# Patient Record
Sex: Male | Born: 1990 | Race: White | Hispanic: No | Marital: Single | Smoking: Never smoker
Health system: Southern US, Community
[De-identification: ages and names within clinical notes are randomized; demographics above are authoritative.]

## PROBLEM LIST (undated history)

## (undated) HISTORY — PX: HERNIA REPAIR: SHX51

---
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
## 2017-03-03 ENCOUNTER — Emergency Department (HOSPITAL_COMMUNITY): Payer: Self-pay

## 2017-03-03 ENCOUNTER — Emergency Department (HOSPITAL_COMMUNITY)
Admission: EM | Admit: 2017-03-03 | Discharge: 2017-03-03 | Disposition: A | Discharge status: Home / Self Care | Payer: Self-pay | Attending: Emergency Medicine | Admitting: Emergency Medicine

## 2017-03-03 ENCOUNTER — Encounter (HOSPITAL_COMMUNITY): Payer: Self-pay | Admitting: Emergency Medicine

## 2017-03-03 DIAGNOSIS — R072 Precordial pain: Secondary | ICD-10-CM | POA: Insufficient documentation

## 2017-03-03 DIAGNOSIS — R9431 Abnormal electrocardiogram [ECG] [EKG]: Secondary | ICD-10-CM | POA: Insufficient documentation

## 2017-03-03 LAB — CBC
HEMATOCRIT: 49.7 % (ref 39.0–52.0)
HEMOGLOBIN: 17.6 g/dL — AB (ref 13.0–17.0)
MCH: 33.5 pg (ref 26.0–34.0)
MCHC: 35.4 g/dL (ref 30.0–36.0)
MCV: 94.7 fL (ref 78.0–100.0)
Platelets: 214 10*3/uL (ref 150–400)
RBC: 5.25 MIL/uL (ref 4.22–5.81)
RDW: 12.6 % (ref 11.5–15.5)
WBC: 8.9 10*3/uL (ref 4.0–10.5)

## 2017-03-03 LAB — BASIC METABOLIC PANEL
ANION GAP: 5 (ref 5–15)
BUN: 10 mg/dL (ref 6–20)
CALCIUM: 9.5 mg/dL (ref 8.9–10.3)
CO2: 31 mmol/L (ref 22–32)
Chloride: 104 mmol/L (ref 101–111)
Creatinine, Ser: 1.05 mg/dL (ref 0.61–1.24)
GFR calc Af Amer: >60 mL/min (ref >60)
Glucose, Bld: 93 mg/dL (ref 65–99)
POTASSIUM: 4.2 mmol/L (ref 3.5–5.1)
SODIUM: 140 mmol/L (ref 135–145)

## 2017-03-03 LAB — TROPONIN I

## 2017-03-03 MED ORDER — NAPROXEN 500 MG PO TABS: 500.0000 mg | ORAL_TABLET | Freq: Two times a day (BID) | ORAL | 0 refills | Status: DC

## 2017-03-03 MED ORDER — FAMOTIDINE 20 MG PO TABS: 20.0000 mg | ORAL_TABLET | Freq: Two times a day (BID) | ORAL | 0 refills | Status: DC

## 2017-03-03 NOTE — ED Provider Notes (Signed)
[facility]-EMERGENCY DEPT Provider Note   CSN: [id number] Arrival date & time: 03/03/17  1610     History   Chief Complaint Chief Complaint  Patient presents with  . Chest Pain    HPI Chris Brown is a 26 y.o. male.  HPI Pt has been having pain in his chest since sept last year.  The sx were only slight but this past Sunday the sx increased.  After eating something he felt a sharp pain in the center of the chest and went to the left arm.   No fevers.  No vomiting although he felt nauseated when it happened.  No cough. No shortness of breath but he has felt fatigued and has been breathing harder than normal.  No leg swelling.  Pt went to [facility] and they noticed EKG changes and sent him   History reviewed. No pertinent past medical history.  Patient Active Problem List   Diagnosis Date Noted  . DISLOCATION CLOSED FINGER NOS 10/02/2009    Past Surgical History:  Procedure Laterality Date  . HERNIA REPAIR         Home Medications    Prior to Admission medications   Medication Sig Start Date End Date Taking? Authorizing Provider  famotidine (PEPCID) 20 MG tablet Take 1 tablet (20 mg total) by mouth 2 (two) times daily. 03/03/17   Linwood DibblesJon Harding Thomure, MD  naproxen (NAPROSYN) 500 MG tablet Take 1 tablet (500 mg total) by mouth 2 (two) times daily with a meal. As needed for pain 03/03/17   Linwood DibblesJon Sonam Huelsmann, MD    Family History No family history on file.  Social History Social History  Substance Use Topics  . Smoking status: Never Smoker  . Smokeless tobacco: Never Used  . Alcohol use No     Allergies   Patient has no known allergies.   Review of Systems Review of Systems  All other systems reviewed and are negative.    Physical Exam Updated Vital Signs BP 128/75   Pulse 70   Temp 98.6 F (37 C) (Oral)   Resp (!) 26   Ht 5\' 11"  (1.803 m)   Wt 103.9 kg   SpO2 96%   BMI 31.94 kg/m   Physical Exam  Constitutional: He appears well-developed and  well-nourished. No distress.  HENT:  Head: Normocephalic and atraumatic.  Right Ear: External ear normal.  Left Ear: External ear normal.  Eyes: Conjunctivae are normal. Right eye exhibits no discharge. Left eye exhibits no discharge. No scleral icterus.  Neck: Neck supple. No tracheal deviation present.  Cardiovascular: Normal rate, regular rhythm and intact distal pulses.   Pulmonary/Chest: Effort normal and breath sounds normal. No stridor. No respiratory distress. He has no wheezes. He has no rales.  Abdominal: Soft. Bowel sounds are normal. He exhibits no distension. There is no tenderness. There is no rebound and no guarding.  Musculoskeletal: He exhibits no edema or tenderness.  Neurological: He is alert. He has normal strength. No cranial nerve deficit (no facial droop, extraocular movements intact, no slurred speech) or sensory deficit. He exhibits normal muscle tone. He displays no seizure activity. Coordination normal.  Skin: Skin is warm and dry. No rash noted.  Psychiatric: He has a normal mood and affect.  Nursing note and vitals reviewed.    ED Treatments / Results  Labs (all labs ordered are listed, but only abnormal results are displayed) Labs Reviewed  CBC - Abnormal; Notable for the following:  Result Value   Hemoglobin 17.6 (*)    All other components within normal limits  BASIC METABOLIC PANEL  TROPONIN I  TROPONIN I    EKG  EKG Interpretation  Date/Time:  Tuesday March 03 2017 18:07:23 EDT Ventricular Rate:  76 PR Interval:    QRS Duration: 110 QT Interval:  366 QTC Calculation: 412 R Axis:   73 Text Interpretation:  Sinus rhythm inferior and lateral q wages.  new since last tracing Confirmed by Boe Deans  MD-J, Avyukt Cimo [phone]) on 03/03/2017 6:13:50 PM       Radiology Dg Chest 2 View  Result Date: 03/03/2017 CLINICAL DATA:  Chest pain, shortness of Breath EXAM: CHEST  2 VIEW COMPARISON:  None. FINDINGS: Cardiomediastinal silhouette is unremarkable.  No infiltrate or pleural effusion. No pulmonary edema. Bony thorax is unremarkable. IMPRESSION: No active cardiopulmonary disease. Electronically Signed   By: Natasha Mead M.D.   On: 03/03/2017 16:37    Procedures Procedures (including critical care time)  Medications Ordered in ED Medications - No data to display   Initial Impression / Assessment and Plan / ED Course  I have reviewed the triage vital signs and the nursing notes.  Pertinent labs & imaging results that were available during my care of the patient were reviewed by me and considered in my medical decision making (see chart for details).   patient presented to the emergency room with complaints of chest pain not typical for a cardiac etiology.  His laboratory tests are reassuring however he does have an abnormal EKG.  I doubt that his symptoms are related to acute coronary syndrome. It's possible his abnormal EKG could be related to pericarditis however his symptomatology does not suggest that.  Plan on discharging patient home on a course of NSAIDs. I have also prescribed Pepcid since his symptoms sound esophageal in nature.  I asked him to contact Dr. Wyline Mood, from [facility] for further evaluation of his abnormal EKG and his chest pain symptoms  Final Clinical Impressions(s) / ED Diagnoses   Final diagnoses:  Precordial pain  Abnormal ECG    New Prescriptions New Prescriptions   FAMOTIDINE (PEPCID) 20 MG TABLET    Take 1 tablet (20 mg total) by mouth 2 (two) times daily.   NAPROXEN (NAPROSYN) 500 MG TABLET    Take 1 tablet (500 mg total) by mouth 2 (two) times daily with a meal. As needed for pain     Linwood Dibbles, MD 03/03/17 2027

## 2017-03-03 NOTE — Discharge Instructions (Signed)
Please contact the cardiology office to arrange outpatient follow-up, take the medications as prescribed, return as needed for worsening symptoms

## 2017-03-11 NOTE — Progress Notes (Signed)
Cardiology Office Note   Date:  03/12/2017   ID:  Hanish, Laraia 1991-01-02, MRN [id number]  PCP:  [facility]  Cardiologist:   Charlton Haws, MD   No chief complaint on file.     History of Present Illness: Chris Brown is a 26 y.o. male who presents for evaluation/consultation for chest pain Referred by [facility] and Dr Lynelle Doctor [facility]. Reviewed ER notes from 03/03/17  Onset September 2017 [facility] chest radiates to left arm. Associated with nausea fatigue and dyspnea Sent  To ER for abnormal ECG. 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 intermittent lasts 15 minutes at most not pleuritic or positional Laying down helps it has been persistent since d/c from ER       History reviewed. No pertinent past medical history.  Past Surgical History:  Procedure Laterality Date  . HERNIA REPAIR       Current Outpatient Prescriptions  Medication Sig Dispense Refill  . famotidine (PEPCID) 20 MG tablet Take 1 tablet (20 mg total) by mouth 2 (two) times daily. 14 tablet 0  . naproxen (NAPROSYN) 500 MG tablet Take 1 tablet (500 mg total) by mouth 2 (two) times daily with a meal. As needed for pain 20 tablet 0   No current facility-administered medications for this visit.     Allergies:   Patient has no known allergies.    Social History:  The patient  reports that he has never smoked. He has never used smokeless tobacco. He reports that he does not drink alcohol or use drugs.   Family History:  The patient's family history includes CVA in his father and maternal grandmother; Cirrhosis in his maternal grandfather; Dementia in his father; Depression in his maternal grandmother; Heart attack in his paternal grandmother; Hypertension in his father; Lung cancer in his maternal grandmother.    ROS:  Please see the history  of present illness.   Otherwise, review of systems are positive for none.   All other systems are reviewed and negative.    PHYSICAL EXAM: VS:  BP 126/78 (BP Location: Right Arm)   Pulse 98   Ht  (1.803 m)   Wt 231 lb (104.8 kg)   SpO2 97%   BMI 32.22 kg/m  , BMI Body mass index is 32.22 kg/m. Affect appropriate Healthy:  appears stated age HEENT: normal Neck supple with no adenopathy JVP normal no bruits no thyromegaly Lungs clear with no wheezing and good diaphragmatic motion Heart:  S1/S2 no murmur, no rub, gallop or click PMI normal Abdomen: benighn, BS positve, no tenderness, no AAA no bruit.  No HSM or HJR Distal pulses intact with no bruits No edema Neuro non-focal Skin warm and dry No muscular weakness    EKG:  03/04/17 SR rate 85 early repolarization no pericarditis    Recent Labs: 03/03/2017: BUN 10; Creatinine, Ser 1.05; Hemoglobin 17.6; Platelets 214; Potassium 4.2; Sodium 140    Lipid Panel No results found for: CHOL, TRIG, HDL, CHOLHDL, VLDL, LDLCALC, LDLDIRECT    Wt Readings from Last 3 Encounters:  03/12/17 231 lb (104.8 kg)  03/03/17 229 lb (103.9 kg)      Other studies Reviewed: Additional studies/ records that were reviewed today include: ER notes CXR and labs notes from  [facility] .  ASSESSMENT AND PLAN:  1.  Chest Pain atypical muscular encouraged him to take motrin bid for 7 days will do POET and f/u echo  To r/o effusion or evidence of pericardial dx 2. Abnormal ECG early repolarization doubt pericardial inflammation or HOCM see above   Current medicines are reviewed at length with the patient today.  The patient does not have concerns regarding medicines.  The following changes have been made:  no change  Labs/ tests ordered today include: Echo and ETT  Orders Placed This Encounter  Procedures  . Exercise Tolerance Test  . ECHOCARDIOGRAM COMPLETE     Disposition:   FU with me as needed       Signed, Charlton Haws, MD  03/12/2017 2:33 PM    [facility] [address] [place], [place], [place]  [address] Phone: [phone]; Fax: [phone]

## 2017-03-12 ENCOUNTER — Encounter: Payer: Self-pay | Admitting: Cardiovascular Disease

## 2017-03-12 ENCOUNTER — Ambulatory Visit (INDEPENDENT_AMBULATORY_CARE_PROVIDER_SITE_OTHER): Payer: Self-pay | Admitting: Cardiovascular Disease

## 2017-03-12 VITALS — BP 126/78 | HR 98 | Ht 71.0 in | Wt 231.0 lb

## 2017-03-12 DIAGNOSIS — R072 Precordial pain: Secondary | ICD-10-CM

## 2017-03-12 NOTE — Patient Instructions (Signed)
Your physician recommends that you schedule a follow-up appointment in: as needed    Your physician has requested that you have an echocardiogram. Echocardiography is a painless test that uses sound waves to create images of your heart. It provides your doctor with information about the size and shape of your heart and how well your heart's chambers and valves are working. This procedure takes approximately one hour. There are no restrictions for this procedure.     Your physician has requested that you have an exercise tolerance test. For further information please visit [email or web address] Please also follow instruction sheet, as given.       Thank you for choosing [facility] !

## 2017-03-26 ENCOUNTER — Ambulatory Visit (HOSPITAL_COMMUNITY)
Admission: RE | Admit: 2017-03-26 | Discharge: 2017-03-26 | Disposition: A | Discharge status: Home / Self Care | Payer: Self-pay | Source: Ambulatory Visit | Attending: Cardiovascular Disease | Admitting: Cardiovascular Disease

## 2017-03-26 DIAGNOSIS — R072 Precordial pain: Secondary | ICD-10-CM

## 2017-03-26 LAB — EXERCISE TOLERANCE TEST
CHL CUP RESTING HR STRESS: 80 {beats}/min
CSEPEW: 11.8 METS
CSEPPHR: 162 {beats}/min
Exercise duration (min): 9 min
Exercise duration (sec): 33 s
MPHR: 197 {beats}/min
Percent HR: 82 %
RPE: 14

## 2017-03-26 NOTE — Progress Notes (Signed)
*  PRELIMINARY RESULTS* Echocardiogram 2D Echocardiogram has been performed.  Stacey Drain 03/26/2017, 9:23 AM

## 2018-07-18 ENCOUNTER — Emergency Department (HOSPITAL_COMMUNITY)
Admission: EM | Admit: 2018-07-18 | Discharge: 2018-07-18 | Disposition: A | Discharge status: Home / Self Care | Payer: Self-pay | Attending: Emergency Medicine | Admitting: Emergency Medicine

## 2018-07-18 ENCOUNTER — Encounter (HOSPITAL_COMMUNITY): Payer: Self-pay

## 2018-07-18 DIAGNOSIS — L03313 Cellulitis of chest wall: Secondary | ICD-10-CM | POA: Insufficient documentation

## 2018-07-18 MED ORDER — DOXYCYCLINE HYCLATE 100 MG PO CAPS: 100.0000 mg | ORAL_CAPSULE | Freq: Two times a day (BID) | ORAL | 0 refills | Status: DC

## 2018-07-18 MED ORDER — DOXYCYCLINE HYCLATE 100 MG PO TABS: 100.0000 mg | ORAL_TABLET | Freq: Once | ORAL | Status: DC

## 2018-07-18 MED ORDER — DOXYCYCLINE HYCLATE 100 MG PO TABS
ORAL_TABLET | ORAL | Status: AC
  Filled 2018-07-18: qty 1

## 2018-07-18 MED ORDER — DOXYCYCLINE HYCLATE 100 MG PO TABS
100.0000 mg | ORAL_TABLET | Freq: Once | ORAL | Status: AC
  Administered 2018-07-18: 100 mg via ORAL

## 2018-07-18 NOTE — ED Triage Notes (Signed)
Pt has a red, hard area to right shoulder area. Pt reports that he noticed it Friday morning

## 2018-07-18 NOTE — Discharge Instructions (Addendum)
Warm Epsom salt compresses to area for 20 minutes at a time. Take Doxycycline as prescribed and complete the full course.

## 2018-07-18 NOTE — ED Provider Notes (Signed)
[facility] EMERGENCY DEPARTMENT Provider Note   CSN: [id number] Arrival date & time: 07/18/18  1344     History   Chief Complaint Chief Complaint  Patient presents with  . Abscess    HPI Chris Brown is a 27 y.o. male.  27 yo male presents with complaint of a red tender/firm area to the right upper chest wall/shoulder area x 2 days. Patient thought at first he had a bug bite but area persists, is getting larger and more painful. No history of prior abscess or skin infections, otherwise healthy. No other complaints or concerns.      History reviewed. No pertinent past medical history.  Patient Active Problem List   Diagnosis Date Noted  . DISLOCATION CLOSED FINGER NOS 10/02/2009    Past Surgical History:  Procedure Laterality Date  . HERNIA REPAIR          Home Medications    Prior to Admission medications   Medication Sig Start Date End Date Taking? Authorizing Provider  doxycycline (VIBRAMYCIN) 100 MG capsule Take 1 capsule (100 mg total) by mouth 2 (two) times daily. 07/18/18   Jeannie Fend, PA-C  famotidine (PEPCID) 20 MG tablet Take 1 tablet (20 mg total) by mouth 2 (two) times daily. 03/03/17   Linwood Dibbles, MD  naproxen (NAPROSYN) 500 MG tablet Take 1 tablet (500 mg total) by mouth 2 (two) times daily with a meal. As needed for pain 03/03/17   Linwood Dibbles, MD    Family History Family History  Problem Relation Age of Onset  . Hypertension Father   . CVA Father   . Dementia Father   . CVA Maternal Grandmother   . Depression Maternal Grandmother   . Lung cancer Maternal Grandmother   . Cirrhosis Maternal Grandfather   . Heart attack Paternal Grandmother     Social History Social History   Tobacco Use  . Smoking status: Never Smoker  . Smokeless tobacco: Never Used  Substance Use Topics  . Alcohol use: No  . Drug use: No     Allergies   Patient has no known allergies.   Review of Systems Review of Systems  Constitutional: Negative for  fever.  Musculoskeletal: Positive for myalgias. Negative for arthralgias.  Skin: Positive for color change. Negative for wound.  Allergic/Immunologic: Negative for immunocompromised state.  Hematological: Negative for adenopathy.  All other systems reviewed and are negative.    Physical Exam Updated Vital Signs BP (!) 149/87 (BP Location: Left Arm)   Pulse 78   Temp (!) 97.4 F (36.3 C) (Oral)   Resp 16   Ht 5\' 10"  (1.778 m)   Wt 104.3 kg   SpO2 99%   BMI 33.00 kg/m   Physical Exam  Constitutional: He is oriented to person, place, and time. He appears well-developed and well-nourished. No distress.  HENT:  Head: Normocephalic and atraumatic.  Cardiovascular: Intact distal pulses.  Pulmonary/Chest: Effort normal.  Musculoskeletal: He exhibits tenderness. He exhibits no deformity.  Neurological: He is alert and oriented to person, place, and time.  Skin: Skin is warm and dry. He is not diaphoretic. There is erythema.     Psychiatric: He has a normal mood and affect. His behavior is normal.  Nursing note and vitals reviewed.    ED Treatments / Results  Labs (all labs ordered are listed, but only abnormal results are displayed) Labs Reviewed - No data to display  EKG None  Radiology No results found.  Procedures Procedures (including critical  care time)  Medications Ordered in ED Medications  doxycycline (VIBRA-TABS) tablet 100 mg (has no administration in time range)     Initial Impression / Assessment and Plan / ED Course  I have reviewed the triage vital signs and the nursing notes.  Pertinent labs & imaging results that were available during my care of the patient were reviewed by me and considered in my medical decision making (see chart for details).  Clinical Course as of Jul 18 1408  Wynelle LinkSun Jul 18, 2018  1409 Cellulitis vs early abscess to right chest wall. Recommend doxycycline and warm compresses, recheck with PCP in 2 days, return to ER for  worsening or concerning symptoms.   [LM]    Clinical Course User Index [LM] Jeannie FendMurphy, Jaziya Obarr A, PA-C    Final Clinical Impressions(s) / ED Diagnoses   Final diagnoses:  Cellulitis of chest wall    ED Discharge Orders         Ordered    doxycycline (VIBRAMYCIN) 100 MG capsule  2 times daily     07/18/18 1400           Alden HippMurphy, Yonael Tulloch A, PA-C 07/18/18 1410    Cathren LaineSteinl, Kevin, MD 07/18/18 1534

## 2018-07-18 NOTE — ED Notes (Signed)
Raised reddened area to R upper chest shoulder area resembling a boil There is no visible head  Area is hard, raised and reddened

## 2018-07-21 ENCOUNTER — Other Ambulatory Visit: Payer: Self-pay

## 2018-07-21 ENCOUNTER — Emergency Department (HOSPITAL_COMMUNITY)
Admission: EM | Admit: 2018-07-21 | Discharge: 2018-07-21 | Disposition: A | Discharge status: Home / Self Care | Payer: Self-pay | Attending: Emergency Medicine | Admitting: Emergency Medicine

## 2018-07-21 ENCOUNTER — Encounter (HOSPITAL_COMMUNITY): Payer: Self-pay | Admitting: Emergency Medicine

## 2018-07-21 DIAGNOSIS — Z79899 Other long term (current) drug therapy: Secondary | ICD-10-CM | POA: Insufficient documentation

## 2018-07-21 DIAGNOSIS — L089 Local infection of the skin and subcutaneous tissue, unspecified: Secondary | ICD-10-CM

## 2018-07-21 DIAGNOSIS — L723 Sebaceous cyst: Secondary | ICD-10-CM | POA: Insufficient documentation

## 2018-07-21 MED ORDER — POVIDONE-IODINE 10 % EX SOLN
CUTANEOUS | Status: DC | PRN
  Administered 2018-07-21: 17:00:00 via TOPICAL
  Filled 2018-07-21: qty 15

## 2018-07-21 MED ORDER — LIDOCAINE HCL (PF) 1 % IJ SOLN
5.0000 mL | Freq: Once | INTRAMUSCULAR | Status: AC
  Administered 2018-07-21: 5 mL
  Filled 2018-07-21: qty 6

## 2018-07-21 MED ORDER — LIDOCAINE HCL (PF) 1 % IJ SOLN
INTRAMUSCULAR | Status: AC
  Administered 2018-07-21: 17:00:00
  Filled 2018-07-21: qty 2

## 2018-07-21 NOTE — ED Triage Notes (Signed)
Pt states abscess to R shoulder is worse. Has been using warm compresses and antibx. No drainage, fever, N/V/D/.

## 2018-07-21 NOTE — Discharge Instructions (Addendum)
Continue the antibiotics you were prescribed here on Sunday and continue warm compresses to your shoulder (or a warm shower massage to the site) for 10-minutes 2-3 times daily.  Get rechecked for any worsened symptoms.

## 2018-07-21 NOTE — ED Provider Notes (Signed)
[facility] EMERGENCY DEPARTMENT Provider Note   CSN: [id number] Arrival date & time: 07/21/18  1450     History   Chief Complaint Chief Complaint  Patient presents with  . Abscess    HPI Ala BentGeorge W Stringfellow is a 27 y.o. male who was seen here 3 days and placed on doxycycline for a presumptive early cellulitis or abscess returns due to worsened pain and swelling at the site of infection at his right anterior shoulder.  He reports using warm compresses as well but the site continues to enlarge without drainage.  He has found no alleviators.  Denies fevers, chills, myalgias or other complaint.  HPI  History reviewed. No pertinent past medical history.  Patient Active Problem List   Diagnosis Date Noted  . DISLOCATION CLOSED FINGER NOS 10/02/2009    Past Surgical History:  Procedure Laterality Date  . HERNIA REPAIR          Home Medications    Prior to Admission medications   Medication Sig Start Date End Date Taking? Authorizing Provider  doxycycline (VIBRAMYCIN) 100 MG capsule Take 1 capsule (100 mg total) by mouth 2 (two) times daily. 07/18/18   Jeannie FendMurphy, Laura A, PA-C  famotidine (PEPCID) 20 MG tablet Take 1 tablet (20 mg total) by mouth 2 (two) times daily. 03/03/17   Linwood DibblesKnapp, Jon, MD  naproxen (NAPROSYN) 500 MG tablet Take 1 tablet (500 mg total) by mouth 2 (two) times daily with a meal. As needed for pain 03/03/17   Linwood DibblesKnapp, Jon, MD    Family History Family History  Problem Relation Age of Onset  . Hypertension Father   . CVA Father   . Dementia Father   . CVA Maternal Grandmother   . Depression Maternal Grandmother   . Lung cancer Maternal Grandmother   . Cirrhosis Maternal Grandfather   . Heart attack Paternal Grandmother     Social History Social History   Tobacco Use  . Smoking status: Never Smoker  . Smokeless tobacco: Never Used  Substance Use Topics  . Alcohol use: No  . Drug use: No     Allergies   Patient has no known allergies.   Review of  Systems Review of Systems  Constitutional: Negative for chills and fever.  Musculoskeletal: Negative for myalgias.  Skin: Positive for color change and wound.  Neurological: Negative for numbness.     Physical Exam Updated Vital Signs BP (!) 144/91   Pulse 95   Temp 98.9 F (37.2 C) (Oral)   Resp 15   Wt 104.3 kg   SpO2 100%   BMI 33.00 kg/m   Physical Exam  Constitutional: He appears well-developed and well-nourished. No distress.  HENT:  Head: Normocephalic.  Neck: Neck supple.  Cardiovascular: Normal rate.  Pulmonary/Chest: Effort normal. He has no wheezes.  Musculoskeletal: Normal range of motion. He exhibits no edema.  Skin: There is erythema.  4 cm induration right anterior shoulder with central fluctuance.  Abscess palpated deep within the axilla.  No axillary adenopathy.     ED Treatments / Results  Labs (all labs ordered are listed, but only abnormal results are displayed) Labs Reviewed - No data to display  EKG None  Radiology No results found.  Procedures Procedures (including critical care time)  INCISION AND DRAINAGE Performed by: Burgess AmorIDOL, Quantavis Obryant Consent: Verbal consent obtained. Risks and benefits: risks, benefits and alternatives were discussed Type: abscess  Body area: right shoulder/axilla  Anesthesia: local infiltration  Incision was made with a scalpel.  Local  anesthetic: lidocaine 1% without epinephrine  Anesthetic total: 4 ml  Complexity: complex Blunt dissection to break up loculations  Drainage: purulent plus moderate amount of sebum material   Drainage amount: copious  Packing material: no packing  Patient tolerance: Patient tolerated the procedure well with no immediate complications.     Medications Ordered in ED Medications  lidocaine (PF) (XYLOCAINE) 1 % injection 5 mL (5 mLs Other Given by Other 07/21/18 1654)  lidocaine (PF) (XYLOCAINE) 1 % injection (  Given by Other 07/21/18 1655)     Initial Impression /  Assessment and Plan / ED Course  I have reviewed the triage vital signs and the nursing notes.  Pertinent labs & imaging results that were available during my care of the patient were reviewed by me and considered in my medical decision making (see chart for details).     Pt advised to continue warm soaks and complete course of abx prescribed at last visit. Prn f/u anticipated, return precautions discussed.  Final Clinical Impressions(s) / ED Diagnoses   Final diagnoses:  Infected sebaceous cyst of skin    ED Discharge Orders    None       Victoriano Laindol, Quetzaly Ebner, PA-C 07/21/18 2214    Donnetta Hutchingook, Brian, MD 07/22/18 [phone]

## 2019-11-01 ENCOUNTER — Encounter (HOSPITAL_COMMUNITY): Payer: Self-pay | Admitting: Emergency Medicine

## 2019-11-01 ENCOUNTER — Other Ambulatory Visit: Payer: Self-pay

## 2019-11-01 ENCOUNTER — Emergency Department (HOSPITAL_COMMUNITY)
Admission: EM | Admit: 2019-11-01 | Discharge: 2019-11-01 | Disposition: A | Discharge status: Home / Self Care | Payer: Self-pay | Attending: Emergency Medicine | Admitting: Emergency Medicine

## 2019-11-01 DIAGNOSIS — K047 Periapical abscess without sinus: Secondary | ICD-10-CM

## 2019-11-01 DIAGNOSIS — R197 Diarrhea, unspecified: Secondary | ICD-10-CM | POA: Insufficient documentation

## 2019-11-01 DIAGNOSIS — K0889 Other specified disorders of teeth and supporting structures: Secondary | ICD-10-CM | POA: Insufficient documentation

## 2019-11-01 DIAGNOSIS — R1084 Generalized abdominal pain: Secondary | ICD-10-CM | POA: Insufficient documentation

## 2019-11-01 LAB — CBC WITH DIFFERENTIAL/PLATELET
Abs Immature Granulocytes: 0.03 10*3/uL (ref 0.00–0.07)
Basophils Absolute: 0 10*3/uL (ref 0.0–0.1)
Basophils Relative: 0 %
Eosinophils Absolute: 0.1 10*3/uL (ref 0.0–0.5)
Eosinophils Relative: 1 %
HCT: 48.3 % (ref 39.0–52.0)
Hemoglobin: 16.5 g/dL (ref 13.0–17.0)
Immature Granulocytes: 0 %
Lymphocytes Relative: 18 %
Lymphs Abs: 1.3 10*3/uL (ref 0.7–4.0)
MCH: 32.5 pg (ref 26.0–34.0)
MCHC: 34.2 g/dL (ref 30.0–36.0)
MCV: 95.1 fL (ref 80.0–100.0)
Monocytes Absolute: 0.4 10*3/uL (ref 0.1–1.0)
Monocytes Relative: 5 %
Neutro Abs: 5.6 10*3/uL (ref 1.7–7.7)
Neutrophils Relative %: 76 %
Platelets: 208 10*3/uL (ref 150–400)
RBC: 5.08 MIL/uL (ref 4.22–5.81)
RDW: 11.9 % (ref 11.5–15.5)
WBC: 7.4 10*3/uL (ref 4.0–10.5)
nRBC: 0 % (ref 0.0–0.2)

## 2019-11-01 LAB — LIPASE, BLOOD: Lipase: 25 U/L (ref 11–51)

## 2019-11-01 LAB — COMPREHENSIVE METABOLIC PANEL
ALT: 44 U/L (ref 0–44)
AST: 38 U/L (ref 15–41)
Albumin: 4.2 g/dL (ref 3.5–5.0)
Alkaline Phosphatase: 73 U/L (ref 38–126)
Anion gap: 5 (ref 5–15)
BUN: 13 mg/dL (ref 6–20)
CO2: 29 mmol/L (ref 22–32)
Calcium: 9.4 mg/dL (ref 8.9–10.3)
Chloride: 105 mmol/L (ref 98–111)
Creatinine, Ser: 1.05 mg/dL (ref 0.61–1.24)
GFR calc Af Amer: >60 mL/min (ref >60)
GFR calc non Af Amer: >60 mL/min (ref >60)
Glucose, Bld: 101 mg/dL — ABNORMAL HIGH (ref 70–99)
Potassium: 4 mmol/L (ref 3.5–5.1)
Sodium: 139 mmol/L (ref 135–145)
Total Bilirubin: 0.8 mg/dL (ref 0.3–1.2)
Total Protein: 7.7 g/dL (ref 6.5–8.1)

## 2019-11-01 MED ORDER — CLINDAMYCIN HCL 150 MG PO CAPS: 300.0000 mg | ORAL_CAPSULE | Freq: Three times a day (TID) | ORAL | 0 refills | Status: AC

## 2019-11-01 MED ORDER — ACETAMINOPHEN 325 MG PO TABS
650.0000 mg | ORAL_TABLET | Freq: Once | ORAL | Status: AC
  Administered 2019-11-01: 650 mg via ORAL
  Filled 2019-11-01: qty 2

## 2019-11-01 NOTE — ED Provider Notes (Signed)
[facility] EMERGENCY DEPARTMENT Provider Note   CSN: [id number] Arrival date & time: 11/01/19  1154     History   Chief Complaint Chief Complaint  Patient presents with  . Abdominal Pain    HPI Chris Brown is a 28 y.o. male.     28 y.o male with no PMH presents to the ED with a chief complaint of abdominal pain x 1 week. Patient reports he first noted foul taste in his mouth, then he proceeded to have some diarrhea last week.  He reports the diarrhea has subsided, the abdominal pain has gone away.  However he continues to taste a sour in his mouth, he also reports a foul smell to his mouth.  He has not taken any medication for improvement in his symptoms.  He is aware he needs dental repairs however he reports he does not feel that this is the source of his symptoms.  On today's visit he reports his abdominal pain has resolved, he has had no nausea, vomiting, fevers.  No dysphagia, odynophagia.  The history is provided by the patient.  Abdominal Pain Associated symptoms: no chest pain, no fever, no nausea, no shortness of breath and no vomiting     History reviewed. No pertinent past medical history.  Patient Active Problem List   Diagnosis Date Noted  . DISLOCATION CLOSED FINGER NOS 10/02/2009    Past Surgical History:  Procedure Laterality Date  . HERNIA REPAIR          Home Medications    Prior to Admission medications   Medication Sig Start Date End Date Taking? Authorizing Provider  clindamycin (CLEOCIN) 150 MG capsule Take 2 capsules (300 mg total) by mouth 3 (three) times daily for 7 days. 11/01/19 11/08/19  Janeece Fitting, PA-C    Family History Family History  Problem Relation Age of Onset  . Hypertension Father   . CVA Father   . Dementia Father   . CVA Maternal Grandmother   . Depression Maternal Grandmother   . Lung cancer Maternal Grandmother   . Cirrhosis Maternal Grandfather   . Heart attack Paternal Grandmother     Social History Social  History   Tobacco Use  . Smoking status: Never Smoker  . Smokeless tobacco: Never Used  Substance Use Topics  . Alcohol use: No  . Drug use: No     Allergies   Patient has no known allergies.   Review of Systems Review of Systems  Constitutional: Negative for fever.  HENT: Positive for dental problem.   Respiratory: Negative for shortness of breath.   Cardiovascular: Negative for chest pain.  Gastrointestinal: Positive for abdominal pain. Negative for nausea and vomiting.  Genitourinary: Negative for flank pain.  All other systems reviewed and are negative.    Physical Exam Updated Vital Signs BP (!) 143/98 (BP Location: Right Arm)   Pulse 100   Temp 98.2 F (36.8 C) (Oral)   Resp 16   Ht 6' (1.829 m)   Wt 108.4 kg   SpO2 100%   BMI 32.41 kg/m   Physical Exam Vitals signs and nursing note reviewed.  Constitutional:      Appearance: He is well-developed.  HENT:     Head: Normocephalic and atraumatic.     Mouth/Throat:     Lips: Pink.     Mouth: Mucous membranes are moist.     Dentition: Abnormal dentition. Dental tenderness, dental caries and dental abscesses present.     Tongue: No lesions.  Pharynx: Oropharynx is clear. Uvula midline.     Tonsils: No tonsillar exudate or tonsillar abscesses. 0 on the right. 0 on the left.     Comments: Poor dentition throughout. abscess noted to the back of the right side of his mouth.  Cardiovascular:     Rate and Rhythm: Normal rate.  Pulmonary:     Effort: Pulmonary effort is normal.     Breath sounds: Normal breath sounds.  Abdominal:     General: Abdomen is flat. Bowel sounds are normal.     Palpations: Abdomen is soft.     Tenderness: There is no abdominal tenderness. There is no right CVA tenderness or left CVA tenderness.  Neurological:     Mental Status: He is alert and oriented to person, place, and time.      ED Treatments / Results  Labs (all labs ordered are listed, but only abnormal results are  displayed) Labs Reviewed  COMPREHENSIVE METABOLIC PANEL - Abnormal; Notable for the following components:      Result Value   Glucose, Bld 101 (*)    All other components within normal limits  LIPASE, BLOOD  CBC WITH DIFFERENTIAL/PLATELET    EKG None  Radiology No results found.  Procedures Procedures (including critical care time)  Medications Ordered in ED Medications  acetaminophen (TYLENOL) tablet 650 mg (has no administration in time range)     Initial Impression / Assessment and Plan / ED Course  I have reviewed the triage vital signs and the nursing notes.  Pertinent labs & imaging results that were available during my care of the patient were reviewed by me and considered in my medical decision making (see chart for details).      Patient with no PMH presents to the ED with a chief complaint of abdominal pain and sour taste in his mouth x 1 week. He reports multiple episodes of diarrhea without any blood in his stool.  He then reports abdominal pain generalized which has now subsided.  He arrived in the ED hemodynamically stable, heart rate slightly elevated but reports smoking prior to arrival in the ED.     CMP showed no electrolyte derailment, he denies any vomiting or nausea. CBC with no leukocytosis, hemoglobin is within normal limits. Lipase is normal. He was given Tylenol in the ED to help with his pain.  Upon examination of his oropharynx, there is poor dentition throughout, multiple missing teeth along with multiple abscesses noted to the back of his mouth.  He has been afebrile.  Denies any dysphagia or difficulty swallowing. No facial swelling. Will provide patient with script for clindamycin to clear his dental abscess. He will also be provided with the number to the free dental clinic in order to schedule an appointment.   I feel that patient is stable for discharge without emergent condition. Patient understand and agrees with management. Return precautions  provided at length.   Portions of this note were generated with Scientist, clinical (histocompatibility and immunogenetics). Dictation errors may occur despite best attempts at proofreading.  Final Clinical Impressions(s) / ED Diagnoses   Final diagnoses:  Generalized abdominal pain  Dental abscess    ED Discharge Orders         Ordered    clindamycin (CLEOCIN) 150 MG capsule  3 times daily     11/01/19 1415           Claude Manges, PA-C 11/01/19 1422    Donnetta Hutching, MD 11/06/19 2037

## 2019-11-01 NOTE — ED Triage Notes (Signed)
PT states generalized abdominal pain with no n/v/d x2 days. PT states he has had a sour/bitter taste in his mouth for over 2 weeks and some diarrhea x2 weeks ago.

## 2019-11-01 NOTE — Discharge Instructions (Addendum)
I have provided antibiotics for your dental infection.Please take 2 capsules four times a day for the next 7 days.If you experience any fever, difficulty swallowing, voice changes or shortness of breath please return to the ED for reevaluation.   I have also provided the contact to the free dental clinic, please schedule an appointment for evaluation.

## 2020-03-24 ENCOUNTER — Encounter (HOSPITAL_COMMUNITY): Payer: Self-pay | Admitting: Emergency Medicine

## 2020-03-24 ENCOUNTER — Emergency Department (HOSPITAL_COMMUNITY)
Admission: EM | Admit: 2020-03-24 | Discharge: 2020-03-24 | Disposition: A | Discharge status: Home / Self Care | Payer: Self-pay | Attending: Emergency Medicine | Admitting: Emergency Medicine

## 2020-03-24 ENCOUNTER — Other Ambulatory Visit: Payer: Self-pay

## 2020-03-24 ENCOUNTER — Emergency Department (HOSPITAL_COMMUNITY): Payer: Self-pay

## 2020-03-24 DIAGNOSIS — J302 Other seasonal allergic rhinitis: Secondary | ICD-10-CM | POA: Insufficient documentation

## 2020-03-24 DIAGNOSIS — R0982 Postnasal drip: Secondary | ICD-10-CM | POA: Insufficient documentation

## 2020-03-24 DIAGNOSIS — R0981 Nasal congestion: Secondary | ICD-10-CM | POA: Insufficient documentation

## 2020-03-24 NOTE — Discharge Instructions (Signed)
Take Zyrtec once daily for allergies Use Flonase or Nasonex nasal spray for congestion Use nasal saline to moisturize the nasal passages Please follow up with your doctor

## 2020-03-24 NOTE — ED Triage Notes (Signed)
Patient c/o intermittent blood in sputum. Per patient brown reddish color. Patient states occasional cough. Denies any fevers. Per patient has had had some nasal congested.

## 2020-03-24 NOTE — ED Provider Notes (Signed)
[facility] EMERGENCY DEPARTMENT Provider Note   CSN: [id number] Arrival date & time: 03/24/20  1704     History Chief Complaint  Patient presents with  . blood in sputum    Chris Brown is a 29 y.o. male who presents with spitting up blood.  Patient states his he has been having a lot of issues with allergies since February.  He has problems with nasal congestion that comes and goes, postnasal drip, throat irritation from a dry throat.  Occasionally he will notice some dark blood-tinged sputum when he spits.  He is not coughing up blood or vomiting blood.  Today he felt a little bit lightheaded and nauseous and so he decided to come to the emergency department to be checked out.  He has not tried anything for his symptoms.  No current chest pain, shortness of breath, cough, wheezing  HPI     History reviewed. No pertinent past medical history.  Patient Active Problem List   Diagnosis Date Noted  . DISLOCATION CLOSED FINGER NOS 10/02/2009    Past Surgical History:  Procedure Laterality Date  . HERNIA REPAIR         Family History  Problem Relation Age of Onset  . Hypertension Father   . CVA Father   . Dementia Father   . CVA Maternal Grandmother   . Depression Maternal Grandmother   . Lung cancer Maternal Grandmother   . Cirrhosis Maternal Grandfather   . Heart attack Paternal Grandmother     Social History   Tobacco Use  . Smoking status: Never Smoker  . Smokeless tobacco: Never Used  Substance Use Topics  . Alcohol use: No  . Drug use: No    Home Medications Prior to Admission medications   Not on File    Allergies    Patient has no known allergies.  Review of Systems   Review of Systems  Constitutional: Negative for fever.  HENT: Positive for congestion, postnasal drip and sneezing. Negative for nosebleeds.   Respiratory: Negative for cough and shortness of breath.   Cardiovascular: Negative for chest pain.  Allergic/Immunologic: Positive for  environmental allergies.    Physical Exam Updated Vital Signs BP (!) 144/107 (BP Location: Right Arm)   Pulse 98   Temp 98.1 F (36.7 C) (Oral)   Resp 20   Ht 5\' 11"  (1.803 m)   Wt 104.3 kg   SpO2 100%   BMI 32.08 kg/m   Physical Exam Vitals and nursing note reviewed.  Constitutional:      General: He is not in acute distress.    Appearance: Normal appearance. He is well-developed. He is not ill-appearing.  HENT:     Head: Normocephalic and atraumatic.     Right Ear: There is impacted cerumen.     Left Ear: Tympanic membrane normal.     Nose: Congestion present.     Right Nostril: No epistaxis.     Left Nostril: Epistaxis (small bloody area of the mucosa on the septum) present.     Right Turbinates: Enlarged.     Left Turbinates: Enlarged.  Eyes:     General: No scleral icterus.       Right eye: No discharge.        Left eye: No discharge.     Conjunctiva/sclera: Conjunctivae normal.     Pupils: Pupils are equal, round, and reactive to light.  Cardiovascular:     Rate and Rhythm: Normal rate and regular rhythm.  Pulmonary:  Effort: Pulmonary effort is normal. No respiratory distress.     Breath sounds: Wheezing (Slight expiratory wheeze in the right upper lobe) present.  Abdominal:     General: There is no distension.  Musculoskeletal:     Cervical back: Normal range of motion.  Skin:    General: Skin is warm and dry.  Neurological:     Mental Status: He is alert and oriented to person, place, and time.  Psychiatric:        Behavior: Behavior normal.     ED Results / Procedures / Treatments   Labs (all labs ordered are listed, but only abnormal results are displayed) Labs Reviewed - No data to display  EKG None  Radiology DG Chest Portable 1 View  Result Date: 03/24/2020 CLINICAL DATA:  Cough. EXAM: PORTABLE CHEST 1 VIEW COMPARISON:  03/03/2017 FINDINGS: Left costophrenic angle not included in the field of view.The cardiomediastinal contours are  normal for portable technique. The lungs are clear. Pulmonary vasculature is normal. No consolidation, pleural effusion, or pneumothorax. No acute osseous abnormalities are seen. IMPRESSION: No acute chest findings. Electronically Signed   By: Narda Rutherford M.D.   On: 03/24/2020 17:46    Procedures Procedures (including critical care time)  Medications Ordered in ED Medications - No data to display  ED Course  I have reviewed the triage vital signs and the nursing notes.  Pertinent labs & imaging results that were available during my care of the patient were reviewed by me and considered in my medical decision making (see chart for details).  29 year old male presents with allergy symptoms with nasal congestion, postnasal drip, sneezing, and dry irritated throat for several months.  Has been spitting up blood which is likely from irritated nasal mucosa.  He is no hemoptysis or hematemesis.  He has no chest pain or shortness of breath or cough.  Chest x-ray was obtained in triage and is negative.  Patient given reassurance and advised to start Zyrtec, Flonase, nasal saline.  MDM Rules/Calculators/A&P                       Final Clinical Impression(s) / ED Diagnoses Final diagnoses:  Post-nasal drip  Seasonal allergies  Nasal congestion    Rx / DC Orders ED Discharge Orders    None       Bethel Born, PA-C 03/24/20 1929    Vanetta Mulders, MD 04/02/20 [phone]

## 2020-08-22 ENCOUNTER — Emergency Department (HOSPITAL_COMMUNITY)
Admission: EM | Admit: 2020-08-22 | Discharge: 2020-08-22 | Disposition: A | Discharge status: Home / Self Care | Payer: Self-pay | Attending: Emergency Medicine | Admitting: Emergency Medicine

## 2020-08-22 ENCOUNTER — Encounter (HOSPITAL_COMMUNITY): Payer: Self-pay

## 2020-08-22 ENCOUNTER — Other Ambulatory Visit: Payer: Self-pay

## 2020-08-22 DIAGNOSIS — K0889 Other specified disorders of teeth and supporting structures: Secondary | ICD-10-CM

## 2020-08-22 DIAGNOSIS — K029 Dental caries, unspecified: Secondary | ICD-10-CM | POA: Insufficient documentation

## 2020-08-22 MED ORDER — PENICILLIN V POTASSIUM 500 MG PO TABS: 500.0000 mg | ORAL_TABLET | Freq: Three times a day (TID) | ORAL | 0 refills | Status: AC

## 2020-08-22 NOTE — Discharge Instructions (Signed)
You have a dental infection.  Please take the antibiotics as prescribed.  As discussed this requires definitive care with a dentist.  Please call the dentist office and I have provided you the phone number and information for.  Please take the antibiotic 3 times daily as prescribed for the entire course.  Please eat food before you take the antibiotics otherwise he may have some nausea. Return to the emergency department for new or concerning symptoms.  Please use Tylenol or ibuprofen for pain.  You may use 600 mg ibuprofen every 6 hours or 1000 mg of Tylenol every 6 hours.  You may choose to alternate between the 2.  This would be most effective.  Not to exceed 4 g of Tylenol within 24 hours.  Not to exceed 3200 mg ibuprofen 24 hours.

## 2020-08-22 NOTE — ED Triage Notes (Signed)
Pt c/o toothache that started Friday.  Reports started having facial swelling yesterday.

## 2020-08-22 NOTE — ED Provider Notes (Signed)
[facility] EMERGENCY DEPARTMENT Provider Note   CSN: [id number] Arrival date & time: 08/22/20  1884     History Chief Complaint  Patient presents with  . Dental Pain    Chris Brown is a 29 y.o. male.  The history is provided by the patient.  Dental Pain Location:  Upper Upper teeth location:  10/LU lateral incisor, 14/LU 1st molar, 15/LU 2nd molar, 11/LU cuspid, 9/LU central incisor, 13/LU 2nd bicuspid, 12/LU 1st bicuspid and 16/LU 3rd molar Quality:  Aching, dull and constant Severity:  Moderate Onset quality:  Gradual Duration:  5 days Timing:  Constant Progression:  Worsening Chronicity:  New Associated symptoms: no congestion, no fever and no neck pain    Patient is complaining of left upper and frontal dental pain that has been ongoing since Friday.  He states that it has been constant achy worsening.  He has very poor dentition and states that he does not see a dentist regularly he has tried to find a dentist to provide him with a payment plan however he states [place] has been [place]  He denies any fevers, chills, nausea, vomiting, lightheadedness, fatigue or malaise.  He denies any difficulty opening his mouth or chewing. He states that his face is felt swollen the last 2 days.    No past medical history on file.  Patient Active Problem List   Diagnosis Date Noted  . DISLOCATION CLOSED FINGER NOS 10/02/2009    Past Surgical History:  Procedure Laterality Date  . HERNIA REPAIR         Family History  Problem Relation Age of Onset  . Hypertension Father   . CVA Father   . Dementia Father   . CVA Maternal Grandmother   . Depression Maternal Grandmother   . Lung cancer Maternal Grandmother   . Cirrhosis Maternal Grandfather   . Heart attack Paternal Grandmother     Social History   Tobacco Use  . Smoking status: Never Smoker  . Smokeless tobacco: Never Used  Vaping Use  . Vaping Use: Never used  Substance  Use Topics  . Alcohol use: No  . Drug use: No    Home Medications Prior to Admission medications   Medication Sig Start Date End Date Taking? Authorizing Provider  penicillin v potassium (VEETID) 500 MG tablet Take 1 tablet (500 mg total) by mouth 3 (three) times daily for 7 days. 08/22/20 08/29/20  Gailen Shelter, PA    Allergies    Patient has no known allergies.  Review of Systems   Review of Systems  Constitutional: Negative for chills, fatigue and fever.  HENT: Positive for dental problem. Negative for congestion.   Respiratory: Negative for shortness of breath.   Cardiovascular: Negative for chest pain.  Gastrointestinal: Negative for abdominal pain.  Musculoskeletal: Negative for neck pain.    Physical Exam Updated Vital Signs BP (!) 143/89 (BP Location: Right Arm)   Pulse 79   Temp 97.8 F (36.6 C) (Oral)   Resp 18   Ht 5\' 11"  (1.803 m)   Wt 108.9 kg   SpO2 98%   BMI 33.47 kg/m   Physical Exam Vitals and nursing note reviewed.  Constitutional:      General: He is not in acute distress. HENT:     Head: Normocephalic and atraumatic.     Comments: No trismus.  Full range of motion of tongue.  No obvious facial swelling.  Face is symmetric.  Nose: Rhinorrhea present.     Mouth/Throat:     Mouth: Mucous membranes are moist.      Comments: Patient with diffusely poor dentition.  The marked teeth above are completely eroded to the gumline.  There are no obvious periapical abscesses but there is some tenderness to palpation of the gumline.  Full range motion of tongue.  No trismus.  Posterior pharynx without erythema or cobblestoning. Eyes:     General: No scleral icterus. Cardiovascular:     Rate and Rhythm: Normal rate and regular rhythm.     Pulses: Normal pulses.     Heart sounds: Normal heart sounds.  Pulmonary:     Effort: Pulmonary effort is normal. No respiratory distress.     Breath sounds: No wheezing.  Abdominal:     Palpations: Abdomen is  soft.     Tenderness: There is no abdominal tenderness.  Musculoskeletal:     Cervical back: Normal range of motion.     Right lower leg: No edema.     Left lower leg: No edema.  Skin:    General: Skin is warm and dry.     Capillary Refill: Capillary refill takes less than 2 seconds.  Neurological:     Mental Status: He is alert. Mental status is at baseline.  Psychiatric:        Mood and Affect: Mood normal.        Behavior: Behavior normal.     ED Results / Procedures / Treatments   Labs (all labs ordered are listed, but only abnormal results are displayed) Labs Reviewed - No data to display  EKG None  Radiology No results found.  Procedures Procedures (including critical care time)  Medications Ordered in ED Medications - No data to display  ED Course  I have reviewed the triage vital signs and the nursing notes.  Pertinent labs & imaging results that were available during my care of the patient were reviewed by me and considered in my medical decision making (see chart for details).    MDM Rules/Calculators/A&P                          Patient is 29 year old male with past medical history detailed above presented today with left upper dental pain for 5 days.  Has been worsening achy constant.  He has not taken anything for pain.  He does not see a dentist.  He is open to seeing 1 and states that he understands the need for definitive care.  He denies any systemic symptoms.  Physical exam is notable for significant dental erosion of the left upper and frontal upper teeth.  No obvious dental abscess no trismus for range of motion of tongue.  Doubt Ludwick's or deep space abscess.  No significant facial swelling on my exam.  No significant lymphadenopathy.  We will provide patient with penicillin and close dental follow-up.  Given return precautions.   Final Clinical Impression(s) / ED Diagnoses Final diagnoses:  Pain, dental  Pain due to dental caries    Rx /  DC Orders ED Discharge Orders         Ordered    penicillin v potassium (VEETID) 500 MG tablet  3 times daily        08/22/20 0936           Gailen Shelter, PA 08/22/20 9562    Bethann Berkshire, MD 08/22/20 1247

## 2021-02-23 IMAGING — DX DG CHEST 1V PORT
1 series · 1 of 1 positions shown · non-contrast
Comparison: 03/03/2017

CLINICAL DATA: Cough.

EXAM:
PORTABLE CHEST 1 VIEW

[chest ap]
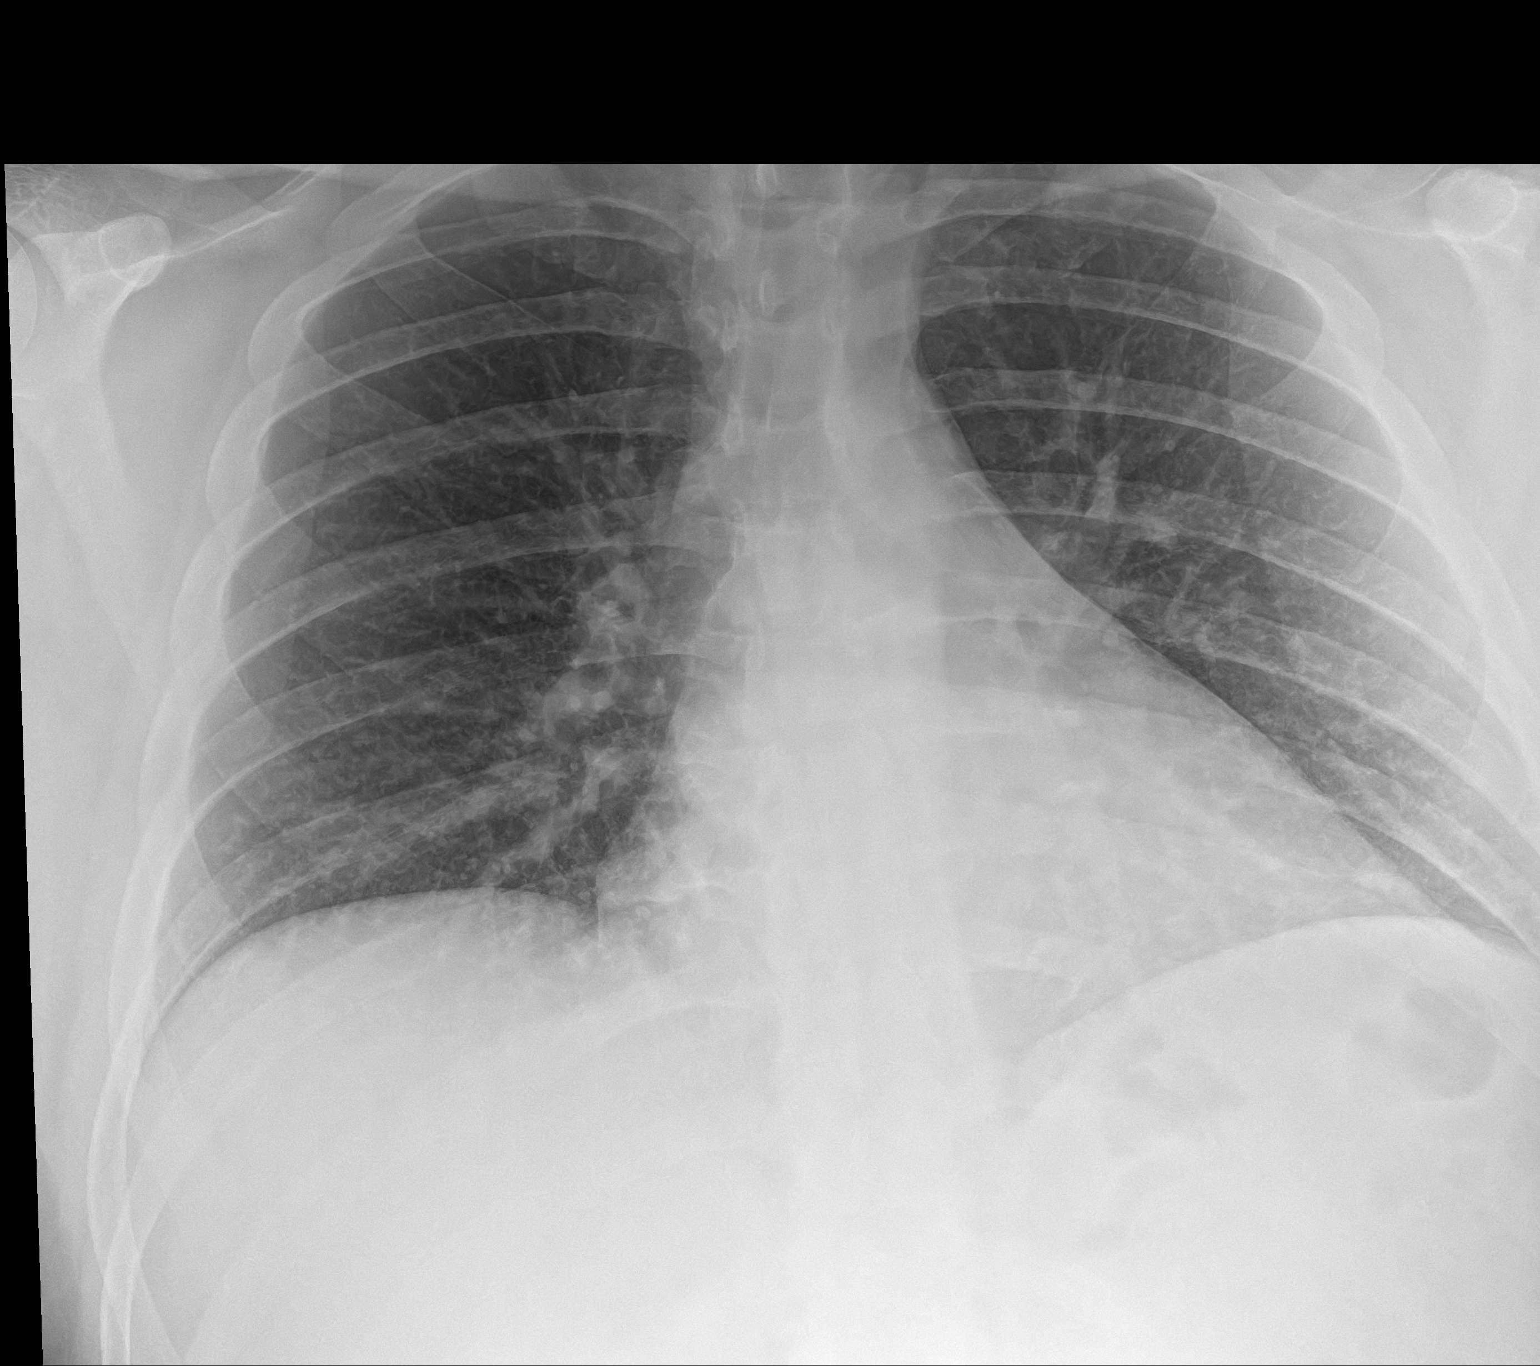

[1 of 1 positions shown; findings below may reference images not displayed]

FINDINGS: Left costophrenic angle not included in the field of view.The
cardiomediastinal contours are normal for portable technique. The
lungs are clear. Pulmonary vasculature is normal. No consolidation,
pleural effusion, or pneumothorax. No acute osseous abnormalities
are seen.
IMPRESSION: No acute chest findings.

## 2022-06-22 ENCOUNTER — Emergency Department
Admission: EM | Admit: 2022-06-22 | Discharge: 2022-06-22 | Disposition: A | Discharge status: Home / Self Care | Payer: Self-pay | Attending: Emergency Medicine | Admitting: Emergency Medicine

## 2022-06-22 ENCOUNTER — Other Ambulatory Visit: Payer: Self-pay

## 2022-06-22 DIAGNOSIS — K029 Dental caries, unspecified: Secondary | ICD-10-CM | POA: Insufficient documentation

## 2022-06-22 DIAGNOSIS — K047 Periapical abscess without sinus: Secondary | ICD-10-CM | POA: Insufficient documentation

## 2022-06-22 DIAGNOSIS — K0381 Cracked tooth: Secondary | ICD-10-CM | POA: Insufficient documentation

## 2022-06-22 MED ORDER — KETOROLAC TROMETHAMINE 30 MG/ML IJ SOLN
30.0000 mg | Freq: Once | INTRAMUSCULAR | Status: AC
  Administered 2022-06-22: 30 mg via INTRAMUSCULAR
  Filled 2022-06-22: qty 1

## 2022-06-22 MED ORDER — AMOXICILLIN-POT CLAVULANATE 875-125 MG PO TABS
1.0000 | ORAL_TABLET | Freq: Once | ORAL | Status: AC
  Administered 2022-06-22: 1 via ORAL
  Filled 2022-06-22: qty 1

## 2022-06-22 MED ORDER — AMOXICILLIN-POT CLAVULANATE 875-125 MG PO TABS: 1.0000 | ORAL_TABLET | Freq: Two times a day (BID) | ORAL | 0 refills | Status: AC

## 2022-06-22 MED ORDER — AMOXICILLIN-POT CLAVULANATE 875-125 MG PO TABS: 1.0000 | ORAL_TABLET | Freq: Two times a day (BID) | ORAL | 0 refills | Status: DC

## 2022-06-22 NOTE — ED Triage Notes (Signed)
Pt with right lower jaw pain and dental pain that began this am. Pt appears in no acute distress. Multiple dental caries and broken teeth noted.

## 2022-06-22 NOTE — ED Provider Notes (Signed)
[facility] Provider Note  Patient Contact: 9:44 PM (approximate)   History   Facial Swelling   HPI  Chris Brown is a 31 y.o. male presents to the emergency department with right lower dental pain that started this AM.  Patient has no pain underneath the tongue or difficulty swallowing.  He has not yet made an appointment with a local dentist.      Physical Exam   Triage Vital Signs: ED Triage Vitals [06/22/22 2057]  Enc Vitals Group     BP (!) 158/92     Pulse Rate 90     Resp 18     Temp 98.1 F (36.7 C)     Temp Source Oral     SpO2 98 %     Weight 245 lb (111.1 kg)     Height 5\' 11"  (1.803 m)     Head Circumference      Peak Flow      Pain Score      Pain Loc      Pain Edu?      Excl. in GC?     Most recent vital signs: Vitals:   06/22/22 2057  BP: (!) 158/92  Pulse: 90  Resp: 18  Temp: 98.1 F (36.7 C)  SpO2: 98%     General: Alert and in no acute distress. Eyes:  PERRL. EOMI. Head: No acute traumatic findings ENT:      Nose: No congestion/rhinnorhea.      Mouth/Throat: Mucous membranes are moist.  Patient has swelling of right lower jaw.  No pain underneath the tongue.  Multiple broken teeth and dental caries in the affected area. Neck: No stridor. No cervical spine tenderness to palpation. Cardiovascular:  Good peripheral perfusion Respiratory: Normal respiratory effort without tachypnea or retractions. Lungs CTAB. Good air entry to the bases with no decreased or absent breath sounds. Gastrointestinal: Bowel sounds 4 quadrants. Soft and nontender to palpation. No guarding or rigidity. No palpable masses. No distention. No CVA tenderness. Musculoskeletal: Full range of motion to all extremities.  Neurologic:  No gross focal neurologic deficits are appreciated.  Skin:   No rash noted Other:   ED Results / Procedures / Treatments   Labs (all labs ordered are listed, but only abnormal results are displayed) Labs  Reviewed - No data to display        PROCEDURES:  Critical Care performed: No  Procedures   MEDICATIONS ORDERED IN ED: Medications  amoxicillin-clavulanate (AUGMENTIN) 875-125 MG per tablet 1 tablet (has no administration in time range)  ketorolac (TORADOL) 30 MG/ML injection 30 mg (has no administration in time range)     IMPRESSION / MDM / ASSESSMENT AND PLAN / ED COURSE  I reviewed the triage vital signs and the nursing notes.                              Assessment and plan Dental pain 31 year old male presents to the emergency department with a right lower jaw dental abscess.  Patient has multiple broken teeth and dental caries in the affected area.  We will treat with Augmentin and given injection of Toradol in the emergency department.  Dental resources provided in patient's discharge paperwork.      FINAL CLINICAL IMPRESSION(S) / ED DIAGNOSES   Final diagnoses:  Dental abscess     Rx / DC Orders   ED Discharge Orders  Ordered    amoxicillin-clavulanate (AUGMENTIN) 875-125 MG tablet  2 times daily        06/22/22 2141             Note:  This document was prepared using Dragon voice recognition software and may include unintentional dictation errors.   Pia Mau Trezevant, Cordelia Poche 06/22/22 2147    Shaune Pollack, MD 06/30/22 [phone]

## 2022-06-22 NOTE — Discharge Instructions (Addendum)
OPTIONS FOR DENTAL FOLLOW UP CARE ° °[place] Department of Health and Human Services - Local Safety Net Dental Clinics °http://www.ncdhhs.gov/dph/oralhealth/services/safetynetclinics.htm °  °[facility] (336-562-3123) ° °[facility] ([phone]) ° °[facility] ([phone]) ° °[facility] ([phone]) ° °[facility] ([phone]) °This clinic caters to the indigent population and is on a lottery system. °Location: °[facility], [address], [place] °Clinic Hours: °Wednesdays from 6pm - 9pm, patients seen by a lottery system. °For dates, call or go to www.med.[facility].edu/[facility]ac/patients/Dental-[facility] °Services: °Cleanings, fillings and simple extractions. °Payment Options: °DENTAL WORK IS FREE OF CHARGE. Bring proof of income or support. °Best way to get seen: °Arrive at 5:15 pm - this is a lottery, NOT first come/first serve, so arriving earlier will not increase your chances of being seen. °  °  °[facility] °[phone] °Select option 1 for emergencies °  °Location: °[facility], [address], [place] °Clinic Hours: °No walk-ins accepted - call the day before to schedule an appointment. °Check in times are 9:30 am and 1:30 pm. °Services: °Simple extractions, temporary fillings, pulpectomy/pulp debridement, uncomplicated abscess drainage. °Payment Options: °PAYMENT IS DUE AT THE TIME OF SERVICE.  Fee is usually $100-200, additional surgical procedures (e.g. abscess drainage) may be extra. °Cash, checks, Visa/MasterCard accepted.  Can file Medicaid if patient is covered for dental - patient should call case worker to check. °No discount for [facility] patients. °Best way to get seen: °MUST call the day before and get onto the schedule. Can usually be seen the next 1-2 days. No walk-ins accepted. °  °  °[facility] °[phone] °   °Location: °[facility], [address], [place] °Clinic Hours: °M, W, Th, F 8am or 1:30pm, Tues 9a or 1:30 - first come/first served. °Services: °Simple extractions, temporary fillings, uncomplicated abscess drainage.  You do not need to be an [place] resident. °Payment Options: °PAYMENT IS DUE AT THE TIME OF SERVICE. °Dental insurance, otherwise sliding scale - bring proof of income or support. °Depending on income and treatment needed, cost is usually $50-200. °Best way to get seen: °Arrive early as it is first come/first served. °  °  °[facility] °[phone] °  °Location: °[address] °Clinic Hours: °Mon-Thu 8a-5p °Services: °Most basic dental services including extractions and fillings. °Payment Options: °PAYMENT IS DUE AT THE TIME OF SERVICE. °Sliding scale, up to 50% off - bring proof if income or support. °Medicaid with dental option accepted. °Best way to get seen: °Call to schedule an appointment, can usually be seen within 2 weeks OR they will try to see walk-ins - show up at 8a or 2p (you may have to wait). °  °  °[facility] °[phone] °[place] RESIDENTS ONLY °  °Location: °[facility], [address], [place], [place] [address] °Clinic Hours: By appointment only. °Monday - Thursday 8am-5pm, Friday 8am-12pm °Services: Cleanings, fillings, extractions. °Payment Options: °PAYMENT IS DUE AT THE TIME OF SERVICE. °Cash, Visa or MasterCard. Sliding scale - $30 minimum per service. °Best way to get seen: °Come in to office, complete packet and make an appointment - need proof of income °or support monies for each household member and proof of [place] residence. °Usually takes about a month to get in. °  °  °[facility] °[phone] °  °Location: °[address]   [place] °Clinic Hours: Walk-in Urgent Care Dental Services are offered Monday-Friday  mornings only. °The numbers of emergencies accepted daily is limited to the number of °providers available. °Maximum 15 - Mondays, Wednesdays & Thursdays °Maximum 10 - Tuesdays & Fridays °Services: °You do not need to be a [place] resident to be seen for a dental emergency. °Emergencies are defined as pain, swelling, abnormal bleeding, or dental trauma. Walkins will receive x-rays if needed. °NOTE: Dental cleaning is not an emergency. °Payment Options: °PAYMENT IS DUE AT THE TIME OF SERVICE. °Minimum co-pay is $40.00 for uninsured patients. °Minimum co-pay is $3.00 for Medicaid with dental coverage. °Dental Insurance is accepted and must be presented at time of visit. °Medicare does not cover dental. °Forms of payment: Cash, credit card, checks. °Best way to get seen: °If not previously registered with the clinic, walk-in dental registration begins at 7:15 am and is on a first come/first serve basis. °If previously registered with the clinic, call to make an appointment. °  °  °[facility] °[phone] °[place] RESIDENTS ONLY °  °Location: °[address], [place], [place] °Clinic Hours: °Mon-Thu 10a-2p °Services: Extractions only! °Payment Options: °FREE (donations accepted) - bring proof of income or support °Best way to get seen: °Call and schedule an appointment OR come at 8am on the 1st Monday of every month (except for holidays) when it is first come/first served. °  °  °[facility] °[phone] °  °Location: °[address], [place] °Clinic Hours: °Friday mornings °Services, Payment Options, Best way to get seen: °Call for info °

## 2022-11-03 ENCOUNTER — Other Ambulatory Visit: Payer: Self-pay

## 2022-11-03 ENCOUNTER — Encounter: Payer: Self-pay | Admitting: Emergency Medicine

## 2022-11-03 ENCOUNTER — Emergency Department
Admission: EM | Admit: 2022-11-03 | Discharge: 2022-11-03 | Disposition: A | Discharge status: Home / Self Care | Payer: Self-pay | Attending: Emergency Medicine | Admitting: Emergency Medicine

## 2022-11-03 DIAGNOSIS — K047 Periapical abscess without sinus: Secondary | ICD-10-CM | POA: Insufficient documentation

## 2022-11-03 MED ORDER — AMOXICILLIN-POT CLAVULANATE 875-125 MG PO TABS: 1.0000 | ORAL_TABLET | Freq: Two times a day (BID) | ORAL | 0 refills | Status: AC

## 2022-11-03 MED ORDER — TRAMADOL HCL 50 MG PO TABS: 50.0000 mg | ORAL_TABLET | Freq: Four times a day (QID) | ORAL | 0 refills | Status: DC | PRN

## 2022-11-03 NOTE — ED Provider Notes (Signed)
[facility] Provider Note  Patient Contact: 4:08 PM (approximate)   History   Dental Pain   HPI  Chris Brown is a 31 y.o. male who presents to the emergency department complaining of left lower dental pain.  Patient has bad teeth and states that he has just established with a dentist and will need several teeth pulled.  He has had infections in the past this feels similar.  No fevers or chills, difficulty breathing or swallowing.  No edema or pain in the submandibular or anterior neck region.     Physical Exam   Triage Vital Signs: ED Triage Vitals  Enc Vitals Group     BP 11/03/22 1502 (!) 146/96     Pulse Rate 11/03/22 1502 (!) 101     Resp 11/03/22 1502 18     Temp 11/03/22 1502 (!) 97.5 F (36.4 C)     Temp Source 11/03/22 1502 Oral     SpO2 11/03/22 1502 95 %     Weight --      Height --      Head Circumference --      Peak Flow --      Pain Score 11/03/22 1459 6     Pain Loc --      Pain Edu? --      Excl. in [place]? --     Most recent vital signs: Vitals:   11/03/22 1502  BP: (!) 146/96  Pulse: (!) 101  Resp: 18  Temp: (!) 97.5 F (36.4 C)  SpO2: 95%     General: Alert and in no acute distress. ENT:      Ears:       Nose: No congestion/rhinnorhea.      Mouth/Throat: Mucous membranes are moist.  No gross erythema or edema of the mandibular region or submandibular region. Neck: No stridor. No cervical spine tenderness to palpation. Hematological/Lymphatic/Immunilogical: No cervical lymphadenopathy. Cardiovascular:  Good peripheral perfusion Respiratory: Normal respiratory effort without tachypnea or retractions. Lungs CTAB.  Musculoskeletal: Full range of motion to all extremities.  Neurologic:  No gross focal neurologic deficits are appreciated.  Skin:   No rash noted Other:   ED Results / Procedures / Treatments   Labs (all labs ordered are listed, but only abnormal results are displayed) Labs Reviewed - No data to  display   EKG     RADIOLOGY    No results found.  PROCEDURES:  Critical Care performed: No  Procedures   MEDICATIONS ORDERED IN ED: Medications - No data to display   IMPRESSION / MDM / [place] / ED COURSE  I reviewed the triage vital signs and the nursing notes.                              Differential diagnosis includes, but is not limited to, dental abscess, sialodenitis, parotiditis, sinusitis   Patient's presentation is most consistent with acute presentation with potential threat to life or bodily function.   Patient's diagnosis is consistent with dental infection.  Patient presents to the emergency department left lower dental pain.  No indication for labs or imaging.  Patiently placed on antibiotics.  Follow-up with dentist as needed.  Return cautions discussed with the patient..  Patient is given ED precautions to return to the ED for any worsening or new symptoms.        FINAL CLINICAL IMPRESSION(S) / ED DIAGNOSES   Final  diagnoses:  Dental infection     Rx / DC Orders   ED Discharge Orders          Ordered    amoxicillin-clavulanate (AUGMENTIN) 875-125 MG tablet  2 times daily        11/03/22 1616    traMADol (ULTRAM) 50 MG tablet  Every 6 hours PRN        11/03/22 1616             Note:  This document was prepared using Dragon voice recognition software and may include unintentional dictation errors.   Lanette Hampshire 11/03/22 1617    Sharman Cheek, MD 11/05/22 [phone]

## 2022-11-03 NOTE — ED Triage Notes (Signed)
Patient to ED via POV for left lower dental pain. Patient states pain has increased throughout the day.

## 2023-05-14 ENCOUNTER — Encounter: Payer: Self-pay | Admitting: Emergency Medicine

## 2023-05-14 ENCOUNTER — Emergency Department
Admission: EM | Admit: 2023-05-14 | Discharge: 2023-05-14 | Disposition: A | Discharge status: Home / Self Care | Payer: Self-pay | Attending: Emergency Medicine | Admitting: Emergency Medicine

## 2023-05-14 ENCOUNTER — Other Ambulatory Visit: Payer: Self-pay

## 2023-05-14 DIAGNOSIS — K029 Dental caries, unspecified: Secondary | ICD-10-CM | POA: Insufficient documentation

## 2023-05-14 MED ORDER — AMOXICILLIN 875 MG PO TABS: 875.0000 mg | ORAL_TABLET | Freq: Two times a day (BID) | ORAL | 0 refills | Status: DC

## 2023-05-14 NOTE — ED Provider Notes (Signed)
   [facility] Provider Note    Event Date/Time   First MD Initiated Contact with Patient 05/14/23 [phone]     (approximate)   History   Dental Pain   HPI  Chris Brown is a 32 y.o. male presents to the ED with complaint of dental pain for the last 2 days.  Patient is aware that he has need for dental care and is currently looking for a dentist.     Physical Exam   Triage Vital Signs: ED Triage Vitals  Enc Vitals Group     BP 05/14/23 0748 (!) 165/98     Pulse Rate 05/14/23 0748 88     Resp 05/14/23 0748 16     Temp 05/14/23 0748 98 F (36.7 C)     Temp Source 05/14/23 0748 Oral     SpO2 05/14/23 0748 96 %     Weight 05/14/23 0747 245 lb (111.1 kg)     Height 05/14/23 0747 5\' 11"  (1.803 m)     Head Circumference --      Peak Flow --      Pain Score 05/14/23 0747 4     Pain Loc --      Pain Edu? --      Excl. in [place]? --     Most recent vital signs: Vitals:   05/14/23 0748  BP: (!) 165/98  Pulse: 88  Resp: 16  Temp: 98 F (36.7 C)  SpO2: 96%     General: Awake, no distress.  CV:  Good peripheral perfusion.  Resp:  Normal effort.  Abd:  No distention.  Other:  Multiple large dental caries with enamel erosion noted to the left lower molars.   ED Results / Procedures / Treatments   Labs (all labs ordered are listed, but only abnormal results are displayed) Labs Reviewed - No data to display   PROCEDURES:  Critical Care performed:   Procedures   MEDICATIONS ORDERED IN ED: Medications - No data to display   IMPRESSION / MDM / ASSESSMENT AND PLAN / ED COURSE  I reviewed the triage vital signs and the nursing notes.   Differential diagnosis includes, but is not limited to, dental pain secondary to caries, dental abscess, gingivitis.  32 year old male presents to the ED with complaint of dental pain for several days.  Patient was given a list of dental clinics in the area available for him to call and make an appointment.   A prescription for amoxicillin 875 twice daily for 10 days was sent to the pharmacy and he is encouraged to take Tylenol or ibuprofen as needed.      Patient's presentation is most consistent with acute, uncomplicated illness.  FINAL CLINICAL IMPRESSION(S) / ED DIAGNOSES   Final diagnoses:  Pain due to dental caries     Rx / DC Orders   ED Discharge Orders          Ordered    amoxicillin (AMOXIL) 875 MG tablet  2 times daily        05/14/23 0807             Note:  This document was prepared using Dragon voice recognition software and may include unintentional dictation errors.   Tommi Rumps, PA-C 05/14/23 1319    Sharman Cheek, MD 05/14/23 [phone]

## 2023-05-14 NOTE — ED Triage Notes (Signed)
Patient c/o left lower dental pain since Tuesday. Has been taking tylenol and ibuprofen.

## 2023-05-14 NOTE — ED Notes (Signed)
See triage note  Presents with possible dental abscess  Having pain with some swelling to left gum line

## 2023-05-14 NOTE — Discharge Instructions (Signed)
Call the clinics listed on your discharge papers to obtain an appointment.  Begin taking antibiotics until completely finished which is twice a day for the next 10 days.  You may take Tylenol or ibuprofen with these medications.  A list of dental clinics is on your discharge papers.  OPTIONS FOR DENTAL FOLLOW UP CARE  [place] Department of Health and Human Services - Local Safety Net Dental Clinics [email or web address]   [facility] [phone])  [facility] Ma [phone])  [place] [phone])  [facility] [phone])  [facility] [phone]) This clinic caters to the indigent population and is on a lottery system. Location: [employer] [facility] [facility], [employer], [address], [place] Clinic Hours: Wednesdays from 6pm - 9pm, patients seen by a lottery system. For dates, call or go to [email or web address] Services: Cleanings, fillings and simple extractions. Payment Options: DENTAL WORK IS FREE OF CHARGE. Bring proof of income or support. Best way to get seen: Arrive at 5:15 pm - this is a lottery, NOT first come/first serve, so arriving earlier will not increase your chances of being seen.     [facility] [phone] Select option 1 for emergencies   Location: [facility], [place], [address] [place] Clinic Hours: No walk-ins accepted - call the day before to schedule an appointment. Check in times are 9:30 am and 1:30 pm. Services: Simple extractions, temporary fillings, pulpectomy/pulp debridement, uncomplicated abscess drainage. Payment Options: PAYMENT IS DUE AT THE TIME OF SERVICE.  Fee is usually $100-200, additional surgical procedures (e.g. abscess drainage) may be extra. Cash, checks, Visa/MasterCard accepted.  Can file Medicaid if patient is covered for dental  - patient should call case worker to check. No discount for [facility] patients. Best way to get seen: MUST call the day before and get onto the schedule. Can usually be seen the next 1-2 days. No walk-ins accepted.     [facility] [phone]   Location: [facility], [address], [place] Clinic Hours: M, W, Th, F 8am or 1:30pm, Tues 9a or 1:30 - first come/first served. Services: Simple extractions, temporary fillings, uncomplicated abscess drainage.  You do not need to be an [facility] resident. Payment Options: PAYMENT IS DUE AT THE TIME OF SERVICE. Dental insurance, otherwise sliding scale - bring proof of income or support. Depending on income and treatment needed, cost is usually $50-200. Best way to get seen: Arrive early as it is first come/first served.     [facility] [phone]   Location: [address] Clinic Hours: Mon-Thu 8a-5p Services: Most basic dental services including extractions and fillings. Payment Options: PAYMENT IS DUE AT THE TIME OF SERVICE. Sliding scale, up to 50% off - bring proof if income or support. Medicaid with dental option accepted. Best way to get seen: Call to schedule an appointment, can usually be seen within 2 weeks OR they will try to see walk-ins - show up at 8a or 2p (you may have to wait).     [facility] [phone] [place] ONLY   Location: [facility], [address] [place], [place] [address] Clinic Hours: By appointment only. Monday - Thursday 8am-5pm, Friday 8am-12pm Services: Cleanings, fillings, extractions. Payment Options: PAYMENT IS DUE AT THE TIME OF SERVICE. Cash, Visa or MasterCard. Sliding scale - $30 minimum per service. Best way to get seen: Come  in to office, complete packet and make an appointment - need proof of income or support monies for each  household member and proof of [facility] residence. Usually takes about a month to get in.     [facility] [phone]   Location: [address], [facility] Clinic Hours: Walk-in Urgent Care Dental Services are offered Monday-Friday mornings only. The numbers of emergencies accepted daily is limited to the number of providers available. Maximum 15 - Mondays, Wednesdays & Thursdays Maximum 10 - Tuesdays & Fridays Services: You do not need to be a [facility] resident to be seen for a dental emergency. Emergencies are defined as pain, swelling, abnormal bleeding, or dental trauma. Walkins will receive x-rays if needed. NOTE: Dental cleaning is not an emergency. Payment Options: PAYMENT IS DUE AT THE TIME OF SERVICE. Minimum co-pay is $40.00 for uninsured patients. Minimum co-pay is $3.00 for Medicaid with dental coverage. Dental Insurance is accepted and must be presented at time of visit. Medicare does not cover dental. Forms of payment: Cash, credit card, checks. Best way to get seen: If not previously registered with the clinic, walk-in dental registration begins at 7:15 am and is on a first come/first serve basis. If previously registered with the clinic, call to make an appointment.     [facility] [phone] [place]IDENTS ONLY   Location: [address] [place], [place] Clinic Hours: Mon-Thu 10a-2p Services: Extractions only! Payment Options: FREE (donations accepted) - bring proof of income or support Best way to get seen: Call and schedule an appointment OR come at 8am on the 1st Monday of every month (except for holidays) when it is first come/first served.     [facility] [phone]   Location: [address] [place], [place] Clinic Hours: Friday mornings Services, Payment Options, Best way to get seen: Call for info

## 2023-07-21 ENCOUNTER — Emergency Department
Admission: EM | Admit: 2023-07-21 | Discharge: 2023-07-21 | Disposition: A | Discharge status: Home / Self Care | Payer: Self-pay | Attending: Emergency Medicine | Admitting: Emergency Medicine

## 2023-07-21 ENCOUNTER — Other Ambulatory Visit: Payer: Self-pay

## 2023-07-21 DIAGNOSIS — K047 Periapical abscess without sinus: Secondary | ICD-10-CM | POA: Insufficient documentation

## 2023-07-21 DIAGNOSIS — K1120 Sialoadenitis, unspecified: Secondary | ICD-10-CM | POA: Insufficient documentation

## 2023-07-21 MED ORDER — AMOXICILLIN-POT CLAVULANATE 875-125 MG PO TABS: 1.0000 | ORAL_TABLET | Freq: Two times a day (BID) | ORAL | 0 refills | Status: AC

## 2023-07-21 MED ORDER — AMOXICILLIN-POT CLAVULANATE 875-125 MG PO TABS
1.0000 | ORAL_TABLET | Freq: Once | ORAL | Status: AC
  Administered 2023-07-21: 1 via ORAL
  Filled 2023-07-21: qty 1

## 2023-07-21 NOTE — ED Triage Notes (Signed)
Pt sts that he is having neck pain since yesterday.

## 2023-07-21 NOTE — Discharge Instructions (Addendum)
Take antibiotic as directed.  Be sure to rinse your mouth after every meal with warm salty water to help reduce infection.  Use a soft bristle toothbrush for daily dental care.  Follow-up with one of the dental clinics listed below for ongoing treatment and management.  Consider local community clinic for routine primary care.  OPTIONS FOR DENTAL FOLLOW UP CARE  [place] Department of Health and Human Services - Local Safety Net Dental Clinics [email or web address]   [facility] [phone])  [facility] [phone])  [place] [phone])  [facility] [phone])  [facility] [phone]) This clinic caters to the indigent population and is on a lottery system. Location: [employer] [facility] [facility], [employer], [address] [place] Clinic Hours: Wednesdays from 6pm - 9pm, patients seen by a lottery system. For dates, call or go to [email or web address] Services: Cleanings, fillings and simple extractions. Payment Options: DENTAL WORK IS FREE OF CHARGE. Bring proof of income or support. Best way to get seen: Arrive at 5:15 pm - this is a lottery, NOT first come/first serve, so arriving earlier will not increase your chances of being seen.     [facility] [phone] Select option 1 for emergencies   Location: [facility], [place], [address] [place] Clinic Hours: No walk-ins accepted - call the day before to schedule an appointment. Check in times are 9:30 am and 1:30 pm. Services: Simple extractions, temporary fillings, pulpectomy/pulp debridement, uncomplicated abscess drainage. Payment Options: PAYMENT IS DUE AT THE TIME OF SERVICE.  Fee is usually $100-200, additional surgical procedures (e.g. abscess drainage) may be extra. Cash, checks, Visa/MasterCard accepted.  Can  file Medicaid if patient is covered for dental - patient should call case worker to check. No discount for [facility] patients. Best way to get seen: MUST call the day before and get onto the schedule. Can usually be seen the next 1-2 days. No walk-ins accepted.     [facility] [phone]   Location: [facility], [address] [place] Clinic Hours: M, W, Th, F 8am or 1:30pm, Tues 9a or 1:30 - first come/first served. Services: Simple extractions, temporary fillings, uncomplicated abscess drainage.  You do not need to be an [facility] resident. Payment Options: PAYMENT IS DUE AT THE TIME OF SERVICE. Dental insurance, otherwise sliding scale - bring proof of income or support. Depending on income and treatment needed, cost is usually $50-200. Best way to get seen: Arrive early as it is first come/first served.     [facility] [phone]   Location: [address] Clinic Hours: Mon-Thu 8a-5p Services: Most basic dental services including extractions and fillings. Payment Options: PAYMENT IS DUE AT THE TIME OF SERVICE. Sliding scale, up to 50% off - bring proof if income or support. Medicaid with dental option accepted. Best way to get seen: Call to schedule an appointment, can usually be seen within 2 weeks OR they will try to see walk-ins - show up at 8a or 2p (you may have to wait).     [facility] [phone] [place] ONLY   Location: [facility], [address], [place], [place] [address] Clinic Hours: By appointment only. Monday - Thursday 8am-5pm, Friday 8am-12pm Services: Cleanings, fillings, extractions. Payment Options: PAYMENT IS DUE AT THE TIME OF SERVICE. Cash, Visa or MasterCard. Sliding scale - $30 minimum per service.  Best way to get seen: Come in to office, complete packet and make an appointment - need proof  of income or support monies for each household member and proof of [facility] residence. Usually takes about a month to get in.     [facility] [phone]   Location: [address], [facility] Clinic Hours: Walk-in Urgent Care Dental Services are offered Monday-Friday mornings only. The numbers of emergencies accepted daily is limited to the number of providers available. Maximum 15 - Mondays, Wednesdays & Thursdays Maximum 10 - Tuesdays & Fridays Services: You do not need to be a [facility] resident to be seen for a dental emergency. Emergencies are defined as pain, swelling, abnormal bleeding, or dental trauma. Walkins will receive x-rays if needed. NOTE: Dental cleaning is not an emergency. Payment Options: PAYMENT IS DUE AT THE TIME OF SERVICE. Minimum co-pay is $40.00 for uninsured patients. Minimum co-pay is $3.00 for Medicaid with dental coverage. Dental Insurance is accepted and must be presented at time of visit. Medicare does not cover dental. Forms of payment: Cash, credit card, checks. Best way to get seen: If not previously registered with the clinic, walk-in dental registration begins at 7:15 am and is on a first come/first serve basis. If previously registered with the clinic, call to make an appointment.     [facility] [phone] [place]IDENTS ONLY   Location: [address] [place], [place] Clinic Hours: Mon-Thu 10a-2p Services: Extractions only! Payment Options: FREE (donations accepted) - bring proof of income or support Best way to get seen: Call and schedule an appointment OR come at 8am on the 1st Monday of every month (except for holidays) when it is first come/first served.     [facility] [phone]   Location: [address] [place], [place] Clinic Hours: Friday mornings Services, Payment Options, Best way to get seen: Call for info

## 2023-07-21 NOTE — ED Provider Notes (Signed)
[facility] Emergency Department Provider Note     Event Date/Time   First MD Initiated Contact with Patient 07/21/23 2022     (approximate)   History   Neck Injury   HPI  Chris Brown is a 32 y.o. male noncontributory medical history, presents to the ED for evaluation of some neck pain since onset yesterday.  Patient would endorse a palpable tender nodule to the submandibular region on the right side of his neck.  He endorses poor dentition in general, but denies any focal gum swelling, purulence, or drainage.  Also denies any difficulty breathing, swallowing, or controlling oral secretions.  He would also endorse some increased tenderness the area of concern with neck range of motion.  Patient denies any unilateral swelling of his face, cheek, or jaw.  UE also denies any frank fevers, chills, or sweats.   Physical Exam   Triage Vital Signs: ED Triage Vitals  Encounter Vitals Group     BP 07/21/23 1857 (!) 152/94     Systolic BP Percentile --      Diastolic BP Percentile --      Pulse Rate 07/21/23 1857 91     Resp 07/21/23 1857 17     Temp 07/21/23 1857 98.2 F (36.8 C)     Temp Source 07/21/23 1857 Oral     SpO2 07/21/23 1857 95 %     Weight 07/21/23 1858 246 lb (111.6 kg)     Height 07/21/23 1858 5\' 10"  (1.778 m)     Head Circumference --      Peak Flow --      Pain Score 07/21/23 1856 6     Pain Loc --      Pain Education --      Exclude from Growth Chart --     Most recent vital signs: Vitals:   07/21/23 1857  BP: (!) 152/94  Pulse: 91  Resp: 17  Temp: 98.2 F (36.8 C)  SpO2: 95%    General Awake, no distress. NAD HEENT NCAT. PERRL. EOMI. No rhinorrhea. Mucous membranes are moist.  Uvula is midline tonsils are flat.  Oropharyngeal lesions are appreciated.  No brawny sublingual edema is noted.  Patient with poor dentition throughout with multiple teeth broken to the gumline with the rest of caries.  No focal gum swelling,  pointing, fluctuance is appreciated.  Mild palpable submandibular salivary gland versus lymph node noted on the right side. CV:  Good peripheral perfusion.  RESP:  Normal effort.  ABD:  No distention.    ED Results / Procedures / Treatments   Labs (all labs ordered are listed, but only abnormal results are displayed) Labs Reviewed - No data to display   EKG   RADIOLOGY   No results found.   PROCEDURES:  Critical Care performed: No  Procedures   MEDICATIONS ORDERED IN ED: Medications  amoxicillin-clavulanate (AUGMENTIN) 875-125 MG per tablet 1 tablet (1 tablet Oral Given 07/21/23 2136)     IMPRESSION / MDM / ASSESSMENT AND PLAN / ED COURSE  I reviewed the triage vital signs and the nursing notes.                              Differential diagnosis includes, but is not limited to, dental abscess, dental infection, Ludwig's angina, parotitis, lymphadenopathy  Patient's presentation is most consistent with acute, uncomplicated illness.  Patient's diagnosis is consistent with dental infection to  salivary gland inflammation. Patient will be discharged home with prescriptions for Augmentin. Patient is to follow up with local dental provider as needed or otherwise directed. Patient is given ED precautions to return to the ED for any worsening or new symptoms.   FINAL CLINICAL IMPRESSION(S) / ED DIAGNOSES   Final diagnoses:  Dental infection  Salivary gland inflammation     Rx / DC Orders   ED Discharge Orders          Ordered    amoxicillin-clavulanate (AUGMENTIN) 875-125 MG tablet  2 times daily        07/21/23 2116             Note:  This document was prepared using Dragon voice recognition software and may include unintentional dictation errors.    Lissa Hoard, PA-C 07/22/23 2331    Merwyn Katos, MD 07/24/23 [phone]

## 2023-08-04 ENCOUNTER — Emergency Department
Admission: EM | Admit: 2023-08-04 | Discharge: 2023-08-04 | Disposition: A | Discharge status: Home / Self Care | Payer: Self-pay | Attending: Student in an Organized Health Care Education/Training Program | Admitting: Student in an Organized Health Care Education/Training Program

## 2023-08-04 ENCOUNTER — Other Ambulatory Visit: Payer: Self-pay

## 2023-08-04 DIAGNOSIS — R590 Localized enlarged lymph nodes: Secondary | ICD-10-CM | POA: Insufficient documentation

## 2023-08-04 DIAGNOSIS — R030 Elevated blood-pressure reading, without diagnosis of hypertension: Secondary | ICD-10-CM | POA: Insufficient documentation

## 2023-08-04 DIAGNOSIS — K029 Dental caries, unspecified: Secondary | ICD-10-CM

## 2023-08-04 LAB — CBC WITH DIFFERENTIAL/PLATELET
Abs Immature Granulocytes: 0.03 10*3/uL (ref 0.00–0.07)
Basophils Absolute: 0 10*3/uL (ref 0.0–0.1)
Basophils Relative: 0 %
Eosinophils Absolute: 0.1 10*3/uL (ref 0.0–0.5)
Eosinophils Relative: 2 %
HCT: 48.7 % (ref 39.0–52.0)
Hemoglobin: 17.1 g/dL — ABNORMAL HIGH (ref 13.0–17.0)
Immature Granulocytes: 1 %
Lymphocytes Relative: 27 %
Lymphs Abs: 1.7 10*3/uL (ref 0.7–4.0)
MCH: 32.3 pg (ref 26.0–34.0)
MCHC: 35.1 g/dL (ref 30.0–36.0)
MCV: 92.1 fL (ref 80.0–100.0)
Monocytes Absolute: 0.4 10*3/uL (ref 0.1–1.0)
Monocytes Relative: 6 %
Neutro Abs: 4.1 10*3/uL (ref 1.7–7.7)
Neutrophils Relative %: 64 %
Platelets: 220 10*3/uL (ref 150–400)
RBC: 5.29 MIL/uL (ref 4.22–5.81)
RDW: 11.7 % (ref 11.5–15.5)
WBC: 6.3 10*3/uL (ref 4.0–10.5)
nRBC: 0 % (ref 0.0–0.2)

## 2023-08-04 NOTE — ED Triage Notes (Signed)
Pt to ED for left sided neck pain x2 weeks. States has been here recently for dental infection and prescribed antibiotics. Minimal swelling to left side of face noted.

## 2023-08-04 NOTE — Discharge Instructions (Signed)
Today in the emergency department your blood pressure was elevated at 161/102.  This should be the checked by your primary care provider.  If you do not have one of the list of doctors on your discharge papers including [place] health services which includes [facility], [facility] and [place] [facility].  List of dental clinics is also listed on your discharge papers.  OPTIONS FOR DENTAL FOLLOW UP CARE  [place] [facility] - Local Safety Net Dental Clinics [email or web address]   [facility] [phone])  Sharl Ma [phone])  [place] [phone])  [facility] [phone])  [facility] [phone]) This clinic caters to the indigent population and is on a lottery system. Location: [employer] [facility], [employer], [address], [place] Clinic Hours: Wednesdays from 6pm - 9pm, patients seen by a lottery system. For dates, call or go to [email or web address] Services: Cleanings, fillings and simple extractions. Payment Options: DENTAL WORK IS FREE OF CHARGE. Bring proof of income or support. Best way to get seen: Arrive at 5:15 pm - this is a lottery, NOT first come/first serve, so arriving earlier will not increase your chances of being seen.     [facility] [phone] Select option 1 for emergencies   Location: [facility], [place], [address] [place] Clinic Hours: No walk-ins accepted - call the day before to schedule an appointment. Check in times are 9:30 am and 1:30 pm. Services: Simple extractions, temporary fillings, pulpectomy/pulp debridement, uncomplicated abscess drainage. Payment Options: PAYMENT IS DUE AT THE TIME OF SERVICE.  Fee is usually $100-200, additional surgical procedures (e.g. abscess drainage) may  be extra. Cash, checks, Visa/MasterCard accepted.  Can file Medicaid if patient is covered for dental - patient should call case worker to check. No discount for [facility] patients. Best way to get seen: MUST call the day before and get onto the schedule. Can usually be seen the next 1-2 days. No walk-ins accepted.     [facility] [phone]   Location: [facility], [address], [place] Clinic Hours: M, W, Th, F 8am or 1:30pm, Tues 9a or 1:30 - first come/first served. Services: Simple extractions, temporary fillings, uncomplicated abscess drainage.  You do not need to be an [facility] resident. Payment Options: PAYMENT IS DUE AT THE TIME OF SERVICE. Dental insurance, otherwise sliding scale - bring proof of income or support. Depending on income and treatment needed, cost is usually $50-200. Best way to get seen: Arrive early as it is first come/first served.     [facility] [phone]   Location: [address] Clinic Hours: Mon-Thu 8a-5p Services: Most basic dental services including extractions and fillings. Payment Options: PAYMENT IS DUE AT THE TIME OF SERVICE. Sliding scale, up to 50% off - bring proof if income or support. Medicaid with dental option accepted. Best way to get seen: Call to schedule an appointment, can usually be seen within 2 weeks OR they will try to see walk-ins - show up at 8a or 2p (you may have to wait).     [facility] [phone] [place] RESIDENTS ONLY   Location: [facility], [address] [place], [place] [address] Clinic Hours: By appointment only. Monday - Thursday 8am-5pm, Friday 8am-12pm Services: Cleanings, fillings, extractions. Payment Options: PAYMENT IS DUE AT THE TIME OF SERVICE. Cash, [employer]  or MasterCard. Sliding scale - $30 minimum per service. Best way to get seen: Come in to  office, complete packet and make an appointment - need proof of income or support monies for each household member and proof of [facility] residence. Usually takes about a month to get in.     [facility] [phone]   Location: [address] [facility] Clinic Hours: Walk-in Urgent Care Dental Services are offered Monday-Friday mornings only. The numbers of emergencies accepted daily is limited to the number of providers available. Maximum 15 - Mondays, Wednesdays & Thursdays Maximum 10 - Tuesdays & Fridays Services: You do not need to be a [facility] resident to be seen for a dental emergency. Emergencies are defined as pain, swelling, abnormal bleeding, or dental trauma. Walkins will receive x-rays if needed. NOTE: Dental cleaning is not an emergency. Payment Options: PAYMENT IS DUE AT THE TIME OF SERVICE. Minimum co-pay is $40.00 for uninsured patients. Minimum co-pay is $3.00 for Medicaid with dental coverage. Dental Insurance is accepted and must be presented at time of visit. Medicare does not cover dental. Forms of payment: Cash, credit card, checks. Best way to get seen: If not previously registered with the clinic, walk-in dental registration begins at 7:15 am and is on a first come/first serve basis. If previously registered with the clinic, call to make an appointment.     [facility] [phone] [place] RESIDENTS ONLY   Location: [address], [place], [place] Clinic Hours: Mon-Thu 10a-2p Services: Extractions only! Payment Options: FREE (donations accepted) - bring proof of income or support Best way to get seen: Call and schedule an appointment OR come at 8am on the 1st Monday of every month (except for holidays) when it is first come/first served.     [facility] [phone]   Location: [address] [place], [place] Clinic Hours: Friday mornings Services, Payment Options, Best way to get  seen: Call for info     Medical offices Please go to the following website to schedule new (and existing) patient appointments:   [email or web address]   The following is a list of primary care offices in the area who are accepting new patients at this time.  Please reach out to one of them directly and let them know you would like to schedule an appointment to follow up on an Emergency Department visit, and/or to establish a new primary care provider (PCP).  There are likely other primary care clinics in the are who are accepting new patients, but this is an excellent place to start:  [facility] Lead physician: Dr Shirlee Latch [address] [place], [place] [address] [phone]  [facility] Lead Physician: Dr Alba Cory [address], [place], [place] [address] [phone]  [facility]  Lead Physician: Dr Olevia Perches [address] [place], [place] [address] [phone]  [facility] Lead Physician: Dr Sofie Hartigan [address], [place], [place] [address] [phone]  [facility] at [facility] Lead Physician: Dr Bari Edward [address] [place], [place], [place] [address] [phone]

## 2023-08-04 NOTE — ED Notes (Signed)
See triage note  Presents with some swelling to left side of face and neck  States he feels a hard area,which is movable to left side of neck  No  fever

## 2023-08-04 NOTE — ED Provider Notes (Signed)
[facility] Provider Note    Event Date/Time   First MD Initiated Contact with Patient 08/04/23 0818     (approximate)   History   Neck Pain   HPI  Chris Brown is a 32 y.o. male   presents to the ED with complaint of left-sided swelling for the last 2 weeks.  Patient was seen recently for a dental infection and prescribed antibiotics.  He has minimal swelling to the left side of his face but has not seen a dentist as of yet and does not have a dentist referral.  Patient states he is here because his girlfriend looked on [employer] and saw that his symptoms suggested cancer.      Physical Exam   Triage Vital Signs: ED Triage Vitals  Encounter Vitals Group     BP 08/04/23 0817 (!) 161/102     Systolic BP Percentile --      Diastolic BP Percentile --      Pulse Rate 08/04/23 0815 75     Resp 08/04/23 0817 18     Temp 08/04/23 0815 98.1 F (36.7 C)     Temp src --      SpO2 08/04/23 0815 96 %     Weight 08/04/23 0816 240 lb (108.9 kg)     Height 08/04/23 0816 5\' 10"  (1.778 m)     Head Circumference --      Peak Flow --      Pain Score 08/04/23 0815 0     Pain Loc --      Pain Education --      Exclude from Growth Chart --     Most recent vital signs: Vitals:   08/04/23 0817 08/04/23 0906  BP: (!) 161/102 (!) 145/85  Pulse:  70  Resp: 18 18  Temp:    SpO2:  96%     General: Awake, no distress.  CV:  Good peripheral perfusion.  Resp:  Normal effort.  Abd:  No distention.  Other:  Left lower premolar molars with poor hygiene and dental caries present.  There is some very small lymph nodes noted on exam that are mobile, nontender and correspond to the teeth that he is having difficulty with.  No skin discoloration present.   ED Results / Procedures / Treatments   Labs (all labs ordered are listed, but only abnormal results are displayed) Labs Reviewed  CBC WITH DIFFERENTIAL/PLATELET - Abnormal; Notable for the following components:       Result Value   Hemoglobin 17.1 (*)    All other components within normal limits      PROCEDURES:  Critical Care performed:   Procedures   MEDICATIONS ORDERED IN ED: Medications - No data to display   IMPRESSION / MDM / ASSESSMENT AND PLAN / ED COURSE  I reviewed the triage vital signs and the nursing notes.   Differential diagnosis includes, but is not limited to, dental carry, dental pain, gingivitis, lymphadenopathy.  32 year old male presents to the ED with concerns of possible cancer as his girlfriend googled his symptoms.  He was recently seen for dental infection and placed on antibiotics which she has taken.  Physical exam is basically benign with some small adenopathy noted.  Patient is reassured with CBC unremarkable.  He was given list of dental clinics to follow-up with.      Patient's presentation is most consistent with acute complicated illness / injury requiring diagnostic workup.  FINAL CLINICAL IMPRESSION(S) / ED DIAGNOSES  Final diagnoses:  Anterior cervical lymphadenopathy  Caries  Elevated blood pressure reading     Rx / DC Orders   ED Discharge Orders     None        Note:  This document was prepared using Dragon voice recognition software and may include unintentional dictation errors.   Tommi Rumps, PA-C 08/04/23 1121    Willy Eddy, MD 08/04/23 1146
